# Patient Record
Sex: Female | Born: 1985 | Race: White | Hispanic: No | Marital: Married | Smoking: Never smoker
Health system: Southern US, Community
[De-identification: ages and names within clinical notes are randomized; demographics above are authoritative.]

## PROBLEM LIST (undated history)

## (undated) DIAGNOSIS — O009 Unspecified ectopic pregnancy without intrauterine pregnancy: Secondary | ICD-10-CM

## (undated) DIAGNOSIS — E119 Type 2 diabetes mellitus without complications: Secondary | ICD-10-CM

## (undated) DIAGNOSIS — K219 Gastro-esophageal reflux disease without esophagitis: Secondary | ICD-10-CM

## (undated) DIAGNOSIS — Z87442 Personal history of urinary calculi: Secondary | ICD-10-CM

## (undated) DIAGNOSIS — E282 Polycystic ovarian syndrome: Secondary | ICD-10-CM

## (undated) DIAGNOSIS — O24419 Gestational diabetes mellitus in pregnancy, unspecified control: Secondary | ICD-10-CM

## (undated) DIAGNOSIS — F32A Depression, unspecified: Secondary | ICD-10-CM

## (undated) DIAGNOSIS — J45909 Unspecified asthma, uncomplicated: Secondary | ICD-10-CM

## (undated) DIAGNOSIS — Z8759 Personal history of other complications of pregnancy, childbirth and the puerperium: Secondary | ICD-10-CM

## (undated) DIAGNOSIS — F419 Anxiety disorder, unspecified: Secondary | ICD-10-CM

## (undated) DIAGNOSIS — F329 Major depressive disorder, single episode, unspecified: Secondary | ICD-10-CM

## (undated) HISTORY — PX: DILATION AND CURETTAGE OF UTERUS: SHX78

## (undated) HISTORY — DX: Unspecified asthma, uncomplicated: J45.909

## (undated) HISTORY — DX: Unspecified ectopic pregnancy without intrauterine pregnancy: O00.90

## (undated) HISTORY — DX: Depression, unspecified: F32.A

## (undated) HISTORY — DX: Polycystic ovarian syndrome: E28.2

## (undated) HISTORY — DX: Gestational diabetes mellitus in pregnancy, unspecified control: O24.419

## (undated) HISTORY — PX: ECTOPIC PREGNANCY SURGERY: SHX613

## (undated) HISTORY — DX: Personal history of urinary calculi: Z87.442

## (undated) HISTORY — DX: Gastro-esophageal reflux disease without esophagitis: K21.9

## (undated) HISTORY — DX: Type 2 diabetes mellitus without complications: E11.9

## (undated) HISTORY — DX: Personal history of other complications of pregnancy, childbirth and the puerperium: Z87.59

## (undated) HISTORY — DX: Anxiety disorder, unspecified: F41.9

---
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
## 2016-08-06 DIAGNOSIS — E282 Polycystic ovarian syndrome: Secondary | ICD-10-CM | POA: Insufficient documentation

## 2019-04-03 DIAGNOSIS — O2620 Pregnancy care for patient with recurrent pregnancy loss, unspecified trimester: Secondary | ICD-10-CM | POA: Insufficient documentation

## 2019-04-05 DIAGNOSIS — Z8759 Personal history of other complications of pregnancy, childbirth and the puerperium: Secondary | ICD-10-CM | POA: Insufficient documentation

## 2019-04-05 DIAGNOSIS — O0910 Supervision of pregnancy with history of ectopic or molar pregnancy, unspecified trimester: Secondary | ICD-10-CM | POA: Insufficient documentation

## 2019-04-05 LAB — OB RESULTS CONSOLE HEPATITIS B SURFACE ANTIGEN: Hepatitis B Surface Ag: NEGATIVE

## 2019-04-05 LAB — OB RESULTS CONSOLE RUBELLA ANTIBODY, IGM: Rubella: IMMUNE

## 2019-05-29 DIAGNOSIS — R52 Pain, unspecified: Secondary | ICD-10-CM | POA: Insufficient documentation

## 2019-05-29 DIAGNOSIS — R000 Tachycardia, unspecified: Secondary | ICD-10-CM | POA: Insufficient documentation

## 2019-06-16 NOTE — L&D Delivery Note (Addendum)
Patient is 34 y.o. [id number] [redacted]w[redacted]d admitted for IOL for oligohydramnios.   Delivery Note At 4:11 PM a viable female was delivered via Vaginal, Spontaneous (Presentation: Right Occiput Anterior).  APGAR: 9, 9; weight pending.   Placenta status: Spontaneous, Intact.  Cord: 3 vessels with the following complications: None.  Anesthesia: None Episiotomy: None Lacerations:  First degree perineal laceration, hemostatic. No repair Suture Repair: none Est. Blood Loss (mL): 55  Mom to postpartum.  Baby to Couplet care / Skin to Skin.  Upon arrival patient was complete and pushing. She pushed with good maternal effort to deliver a healthy baby girl. Baby delivered without difficulty, was noted to have good tone and place on maternal abdomen for oral suctioning, drying and stimulation. Delayed cord clamping performed. Placenta delivered intact with 3V cord. Vaginal canal and perineum was inspected and found to have one first degree laceration that was hemostatic on exam.  We discussed repair versus healing by secondary intention and mom opted for no repair. Pitocin was started and uterus massaged until bleeding slowed. Counts of sharps, instruments, and lap pads were all correct.   Mirian Mo, MD PGY-2 5/16/20214:40 PM  OB FELLOW DELIVERY ATTESTATION  I was gloved and present for the delivery in its entirety, and I agree with the above resident's note. Indication for induction: oligohydramnios.   Patient had an uncomplicated labor course as follows: Initial SVE 1/50/-2. Patient received Foley bulb, Cytotec, Pitocin and AROM. She then progressed to complete with uncomplicated delivery.  Small first degree abrasion that was hemostatic and discussed repair; shared-decision making and patient declined.   Jerilynn Birkenhead, MD [facility] Family Medicine Fellow, [facility] for [employer], [facility]

## 2019-06-19 DIAGNOSIS — R0602 Shortness of breath: Secondary | ICD-10-CM | POA: Insufficient documentation

## 2019-06-19 DIAGNOSIS — O4402 Placenta previa specified as without hemorrhage, second trimester: Secondary | ICD-10-CM

## 2019-06-19 HISTORY — DX: Complete placenta previa nos or without hemorrhage, second trimester: O44.02

## 2019-08-21 ENCOUNTER — Ambulatory Visit (INDEPENDENT_AMBULATORY_CARE_PROVIDER_SITE_OTHER): Payer: Managed Care, Other (non HMO) | Admitting: Obstetrics & Gynecology

## 2019-08-21 ENCOUNTER — Encounter: Payer: Self-pay | Admitting: Obstetrics & Gynecology

## 2019-08-21 ENCOUNTER — Other Ambulatory Visit: Payer: Self-pay

## 2019-08-21 VITALS — BP 140/81 | HR 95 | Ht 62.0 in | Wt 169.0 lb

## 2019-08-21 DIAGNOSIS — O0990 Supervision of high risk pregnancy, unspecified, unspecified trimester: Secondary | ICD-10-CM

## 2019-08-21 DIAGNOSIS — J45909 Unspecified asthma, uncomplicated: Secondary | ICD-10-CM | POA: Insufficient documentation

## 2019-08-21 DIAGNOSIS — O4702 False labor before 37 completed weeks of gestation, second trimester: Secondary | ICD-10-CM

## 2019-08-21 DIAGNOSIS — Z6791 Unspecified blood type, Rh negative: Secondary | ICD-10-CM | POA: Insufficient documentation

## 2019-08-21 DIAGNOSIS — O0992 Supervision of high risk pregnancy, unspecified, second trimester: Secondary | ICD-10-CM

## 2019-08-21 DIAGNOSIS — O99519 Diseases of the respiratory system complicating pregnancy, unspecified trimester: Secondary | ICD-10-CM | POA: Insufficient documentation

## 2019-08-21 DIAGNOSIS — Z3A27 27 weeks gestation of pregnancy: Secondary | ICD-10-CM

## 2019-08-21 DIAGNOSIS — O26899 Other specified pregnancy related conditions, unspecified trimester: Secondary | ICD-10-CM | POA: Insufficient documentation

## 2019-08-21 NOTE — Patient Instructions (Signed)
Return to office for any scheduled appointments. Call the office or go to the MAU at [facility] at [facility] if:  You begin to have strong, frequent contractions  Your water breaks.  Sometimes it is a big gush of fluid, sometimes it is just a trickle that keeps getting your panties wet or running down your legs  You have vaginal bleeding.  It is normal to have a small amount of spotting if your cervix was checked.   You do not feel your baby moving like normal.  If you do not, get something to eat and drink and lay down and focus on feeling your baby move.   If your baby is still not moving like normal, you should call the office or go to MAU.  Any other obstetric concerns.   Preterm Labor and Birth Information  The normal length of a pregnancy is 39-41 weeks. Preterm labor is when labor starts before 37 completed weeks of pregnancy. What are the risk factors for preterm labor? Preterm labor is more likely to occur in women who:  Have certain infections during pregnancy such as a bladder infection, sexually transmitted infection, or infection inside the uterus (chorioamnionitis).  Have a shorter-than-normal cervix.  Have gone into preterm labor before.  Have had surgery on their cervix.  Are younger than age 75 or older than age 16.  Are African American.  Are pregnant with twins or multiple babies (multiple gestation).  Take street drugs or smoke while pregnant.  Do not gain enough weight while pregnant.  Became pregnant shortly after having been pregnant. What are the symptoms of preterm labor? Symptoms of preterm labor include:  Cramps similar to those that can happen during a menstrual period. The cramps may happen with diarrhea.  Pain in the abdomen or lower back.  Regular uterine contractions that may feel like tightening of the abdomen.  A feeling of increased pressure in the pelvis.  Increased watery or bloody mucus discharge from the  vagina.  Water breaking (ruptured amniotic sac). Why is it important to recognize signs of preterm labor? It is important to recognize signs of preterm labor because babies who are born prematurely may not be fully developed. This can put them at an increased risk for:  Long-term (chronic) heart and lung problems.  Difficulty immediately after birth with regulating body systems, including blood sugar, body temperature, heart rate, and breathing rate.  Bleeding in the brain.  Cerebral palsy.  Learning difficulties.  Death. These risks are highest for babies who are born before 34 weeks of pregnancy. How is preterm labor treated? Treatment depends on the length of your pregnancy, your condition, and the health of your baby. It may involve:  Having a stitch (suture) placed in your cervix to prevent your cervix from opening too early (cerclage).  Taking or being given medicines, such as: ? Hormone medicines. These may be given early in pregnancy to help support the pregnancy. ? Medicine to stop contractions. ? Medicines to help mature the baby's lungs. These may be prescribed if the risk of delivery is high. ? Medicines to prevent your baby from developing cerebral palsy. If the labor happens before 34 weeks of pregnancy, you may need to stay in the hospital. What should I do if I think I am in preterm labor? If you think that you are going into preterm labor, call your health care provider right away. How can I prevent preterm labor in future pregnancies? To increase your  chance of having a full-term pregnancy:  Do not use any tobacco products, such as cigarettes, chewing tobacco, and e-cigarettes. If you need help quitting, ask your health care provider.  Do not use street drugs or medicines that have not been prescribed to you during your pregnancy.  Talk with your health care provider before taking any herbal supplements, even if you have been taking them regularly.  Make sure  you gain a healthy amount of weight during your pregnancy.  Watch for infection. If you think that you might have an infection, get it checked right away.  Make sure to tell your health care provider if you have gone into preterm labor before. This information is not intended to replace advice given to you by your health care provider. Make sure you discuss any questions you have with your health care provider. Document Revised: 09/23/2018 Document Reviewed: 10/23/2015 Elsevier Patient Education  2020 Elsevier Inc.  

## 2019-08-21 NOTE — Progress Notes (Signed)
   PRENATAL VISIT NOTE  Subjective:  Janice Hurley is a 34 y.o. [phone] at [redacted]w[redacted]d being seen today for transfer of care from [facility].  She recently moved to this area.  She is currently monitored for the following issues for this high-risk pregnancy and has Supervision of high risk pregnancy, antepartum; Generalized pain; History of pregnancy induced hypertension; PCOS (polycystic ovarian syndrome); History of preterm premature rupture of membranes (PPROM); Shortness of breath; Tachycardia; Maternal asthma complicating pregnancy; and Rh negative state in antepartum period on their problem list.  Patient reports occasional contractions.  Contractions: Irritability. Vag. Bleeding: None.  Movement: Present. Denies leaking of fluid.   The following portions of the patient's history were reviewed and updated as appropriate: allergies, current medications, past family history, past medical history, past social history, past surgical history and problem list.   Objective:   Vitals:   08/21/19 1530 08/21/19 1533  BP: 140/81   Pulse: 95   Weight: 169 lb (76.7 kg)   Height:  5\' 5"  (1.651 m)    Fetal Status: Fetal Heart Rate (bpm): 150   Movement: Present     General:  Alert, oriented and cooperative. Patient is in no acute distress.  Skin: Skin is warm and dry. No rash noted.   Cardiovascular: Normal heart rate noted  Respiratory: Normal respiratory effort, no problems with respiration noted  Abdomen: Soft, gravid, appropriate for gestational age.  Pain/Pressure: Present     Pelvic: Cervical exam performed Dilation: Closed Effacement (%): Thick Station: Ballotable  Extremities: Normal range of motion.  Edema: Trace  Mental Status: Normal mood and affect. Normal behavior. Normal judgment and thought content.   Assessment and Plan:  Pregnancy: at [redacted]w[redacted]d 1. Preterm uterine contractions in second trimester, antepartum Closed cervix.  Patient reassured. She declined Procardia, said it did not work  for her last pregnancy.  2. Supervision of high risk pregnancy, antepartum The nature of [facility] with multiple MDs and other Advanced Practice Providers was explained to patient; also emphasized that residents, students are part of our team.  Preterm labor symptoms and general obstetric precautions including but not limited to vaginal bleeding, contractions, leaking of fluid and fetal movement were reviewed in detail with the patient. Please refer to After Visit Summary for other counseling recommendations.   Return in about 1 week (around 08/28/2019) for RN Visit for BP Check, 3rd trimester labs, Rhogam, TDap//2 weeks: OFFICE OB Visit.  Future Appointments  Date Time Provider Department Center  08/28/2019  9:00 AM [facility] NURSE [facility]  09/08/2019  9:05 AM 09/10/2019, CNM [facility]    Donette Larry, MD

## 2019-08-23 ENCOUNTER — Encounter: Payer: Self-pay | Admitting: Obstetrics & Gynecology

## 2019-08-28 ENCOUNTER — Ambulatory Visit (INDEPENDENT_AMBULATORY_CARE_PROVIDER_SITE_OTHER): Payer: Managed Care, Other (non HMO) | Admitting: *Deleted

## 2019-08-28 ENCOUNTER — Other Ambulatory Visit: Payer: Self-pay

## 2019-08-28 VITALS — BP 118/69 | HR 97 | Temp 98.4°F

## 2019-08-28 DIAGNOSIS — O0990 Supervision of high risk pregnancy, unspecified, unspecified trimester: Secondary | ICD-10-CM

## 2019-08-28 DIAGNOSIS — Z23 Encounter for immunization: Secondary | ICD-10-CM

## 2019-08-28 DIAGNOSIS — O360930 Maternal care for other rhesus isoimmunization, third trimester, not applicable or unspecified: Secondary | ICD-10-CM | POA: Diagnosis not present

## 2019-08-28 DIAGNOSIS — Z3403 Encounter for supervision of normal first pregnancy, third trimester: Secondary | ICD-10-CM

## 2019-08-28 DIAGNOSIS — Z3A28 28 weeks gestation of pregnancy: Secondary | ICD-10-CM | POA: Diagnosis not present

## 2019-08-28 MED ORDER — RHO D IMMUNE GLOBULIN 1500 UNIT/2ML IJ SOSY
300.0000 ug | PREFILLED_SYRINGE | Freq: Once | INTRAMUSCULAR | Status: AC
  Administered 2019-08-28: 300 ug via INTRAMUSCULAR

## 2019-08-29 ENCOUNTER — Encounter: Payer: Self-pay | Admitting: Obstetrics & Gynecology

## 2019-08-29 ENCOUNTER — Other Ambulatory Visit: Payer: Self-pay | Admitting: Obstetrics & Gynecology

## 2019-08-29 ENCOUNTER — Other Ambulatory Visit: Payer: Self-pay

## 2019-08-29 ENCOUNTER — Other Ambulatory Visit: Payer: Self-pay | Admitting: *Deleted

## 2019-08-29 DIAGNOSIS — D696 Thrombocytopenia, unspecified: Secondary | ICD-10-CM

## 2019-08-29 DIAGNOSIS — O24419 Gestational diabetes mellitus in pregnancy, unspecified control: Secondary | ICD-10-CM

## 2019-08-29 DIAGNOSIS — D691 Qualitative platelet defects: Secondary | ICD-10-CM | POA: Insufficient documentation

## 2019-08-29 NOTE — Progress Notes (Signed)
Referral to Nutrition and Diabetes placed per Dr.Leggett

## 2019-08-29 NOTE — Progress Notes (Signed)
Error

## 2019-08-31 LAB — CBC
HCT: 34.2 % — ABNORMAL LOW (ref 35.0–45.0)
Hemoglobin: 11.8 g/dL (ref 11.7–15.5)
MCH: 31.1 pg (ref 27.0–33.0)
MCHC: 34.5 g/dL (ref 32.0–36.0)
MCV: 90 fL (ref 80.0–100.0)
MPV: 11.3 fL (ref 7.5–12.5)
Platelets: 104 10*3/uL — ABNORMAL LOW (ref 140–400)
RBC: 3.8 10*6/uL (ref 3.80–5.10)
RDW: 12.5 % (ref 11.0–15.0)
WBC: 7.8 10*3/uL (ref 3.8–10.8)

## 2019-08-31 LAB — ANTIBODY ID, TITER, AND TYPING, RBC: Antigen Typing (2): NEGATIVE

## 2019-08-31 LAB — 2HR GTT W 1 HR, CARPENTER, 75 G
Glucose, 1 Hr, Gest: 187 mg/dL — ABNORMAL HIGH (ref 65–179)
Glucose, 2 Hr, Gest: 118 mg/dL (ref 65–152)
Glucose, Fasting, Gest: 94 mg/dL — ABNORMAL HIGH (ref 65–91)

## 2019-08-31 LAB — HIV ANTIBODY (ROUTINE TESTING W REFLEX): HIV 1&2 Ab, 4th Generation: NONREACTIVE

## 2019-08-31 LAB — ANTIBODY SCREEN: Antibody Screen: POSITIVE — AB

## 2019-08-31 LAB — RPR: RPR Ser Ql: NONREACTIVE

## 2019-09-04 ENCOUNTER — Other Ambulatory Visit: Payer: Self-pay | Admitting: Obstetrics & Gynecology

## 2019-09-04 DIAGNOSIS — O360130 Maternal care for anti-D [Rh] antibodies, third trimester, not applicable or unspecified: Secondary | ICD-10-CM

## 2019-09-05 ENCOUNTER — Encounter: Payer: Self-pay | Admitting: *Deleted

## 2019-09-05 ENCOUNTER — Telehealth: Payer: Self-pay | Admitting: *Deleted

## 2019-09-05 MED ORDER — BLOOD GLUCOSE MONITOR KIT: PACK | 0 refills | Status: DC

## 2019-09-05 NOTE — Telephone Encounter (Signed)
Pt requesting One Touch Glucose meter and testing supplies be sent to [facility].

## 2019-09-06 ENCOUNTER — Telehealth: Payer: Self-pay | Admitting: *Deleted

## 2019-09-06 ENCOUNTER — Other Ambulatory Visit: Payer: Self-pay

## 2019-09-06 ENCOUNTER — Encounter: Payer: Managed Care, Other (non HMO) | Attending: Obstetrics & Gynecology | Admitting: Registered"

## 2019-09-06 DIAGNOSIS — O99810 Abnormal glucose complicating pregnancy: Secondary | ICD-10-CM

## 2019-09-06 NOTE — Telephone Encounter (Signed)
Pt called stating taht she received a One Touch Verio Flex monitor today and needs to strips and lancets to go with the meter.  This was sent to [facility] via phone.

## 2019-09-08 ENCOUNTER — Encounter: Payer: Managed Care, Other (non HMO) | Admitting: Certified Nurse Midwife

## 2019-09-08 ENCOUNTER — Encounter: Payer: Self-pay | Admitting: Registered"

## 2019-09-08 DIAGNOSIS — O99810 Abnormal glucose complicating pregnancy: Secondary | ICD-10-CM | POA: Insufficient documentation

## 2019-09-08 NOTE — Progress Notes (Signed)
Patient was seen on 09/08/19 for Gestational Diabetes self-management class at [facility]. The following learning objectives were met by the patient during this course:   States the definition of Gestational Diabetes  States why dietary management is important in controlling blood glucose  Describes the effects each nutrient has on blood glucose levels  Demonstrates ability to create a balanced meal plan  Demonstrates carbohydrate counting   States when to check blood glucose levels  Demonstrates proper blood glucose monitoring techniques  States the effect of stress and exercise on blood glucose levels  States the importance of limiting caffeine and abstaining from alcohol and smoking  Blood glucose monitor given: [employer] Lot # [id number] x Exp: 05/14/20 Blood glucose reading: 77 mg/dL  Patient instructed to monitor glucose levels: FBS: 60 - <95; 1 hour: <140; 2 hour: <120  Patient received handouts:  Nutrition Diabetes and Pregnancy, including carb counting list  Patient will be seen for follow-up as needed.

## 2019-09-11 ENCOUNTER — Ambulatory Visit (HOSPITAL_COMMUNITY): Payer: Managed Care, Other (non HMO) | Attending: Obstetrics and Gynecology | Admitting: Obstetrics and Gynecology

## 2019-09-11 ENCOUNTER — Ambulatory Visit (INDEPENDENT_AMBULATORY_CARE_PROVIDER_SITE_OTHER): Payer: Managed Care, Other (non HMO) | Admitting: Obstetrics & Gynecology

## 2019-09-11 ENCOUNTER — Other Ambulatory Visit (HOSPITAL_COMMUNITY): Payer: Self-pay | Admitting: *Deleted

## 2019-09-11 ENCOUNTER — Encounter (HOSPITAL_COMMUNITY): Payer: Self-pay | Admitting: *Deleted

## 2019-09-11 ENCOUNTER — Encounter (HOSPITAL_COMMUNITY): Payer: Self-pay

## 2019-09-11 ENCOUNTER — Other Ambulatory Visit: Payer: Self-pay

## 2019-09-11 ENCOUNTER — Ambulatory Visit (HOSPITAL_COMMUNITY): Payer: Managed Care, Other (non HMO) | Admitting: *Deleted

## 2019-09-11 VITALS — BP 134/78 | HR 89 | Wt 169.0 lb

## 2019-09-11 DIAGNOSIS — D696 Thrombocytopenia, unspecified: Secondary | ICD-10-CM

## 2019-09-11 DIAGNOSIS — O99113 Other diseases of the blood and blood-forming organs and certain disorders involving the immune mechanism complicating pregnancy, third trimester: Secondary | ICD-10-CM | POA: Diagnosis not present

## 2019-09-11 DIAGNOSIS — O360990 Maternal care for other rhesus isoimmunization, unspecified trimester, not applicable or unspecified: Secondary | ICD-10-CM

## 2019-09-11 DIAGNOSIS — O24410 Gestational diabetes mellitus in pregnancy, diet controlled: Secondary | ICD-10-CM

## 2019-09-11 DIAGNOSIS — O24419 Gestational diabetes mellitus in pregnancy, unspecified control: Secondary | ICD-10-CM

## 2019-09-11 DIAGNOSIS — N898 Other specified noninflammatory disorders of vagina: Secondary | ICD-10-CM

## 2019-09-11 DIAGNOSIS — Z7952 Long term (current) use of systemic steroids: Secondary | ICD-10-CM | POA: Insufficient documentation

## 2019-09-11 DIAGNOSIS — Z7982 Long term (current) use of aspirin: Secondary | ICD-10-CM | POA: Diagnosis not present

## 2019-09-11 DIAGNOSIS — O0990 Supervision of high risk pregnancy, unspecified, unspecified trimester: Secondary | ICD-10-CM

## 2019-09-11 DIAGNOSIS — O99513 Diseases of the respiratory system complicating pregnancy, third trimester: Secondary | ICD-10-CM | POA: Insufficient documentation

## 2019-09-11 DIAGNOSIS — J45909 Unspecified asthma, uncomplicated: Secondary | ICD-10-CM | POA: Diagnosis not present

## 2019-09-11 DIAGNOSIS — O360993 Maternal care for other rhesus isoimmunization, unspecified trimester, fetus 3: Secondary | ICD-10-CM

## 2019-09-11 DIAGNOSIS — D691 Qualitative platelet defects: Secondary | ICD-10-CM

## 2019-09-11 DIAGNOSIS — Z3A30 30 weeks gestation of pregnancy: Secondary | ICD-10-CM | POA: Insufficient documentation

## 2019-09-11 DIAGNOSIS — O360130 Maternal care for anti-D [Rh] antibodies, third trimester, not applicable or unspecified: Secondary | ICD-10-CM | POA: Diagnosis present

## 2019-09-11 DIAGNOSIS — O360190 Maternal care for anti-D [Rh] antibodies, unspecified trimester, not applicable or unspecified: Secondary | ICD-10-CM

## 2019-09-11 MED ORDER — ALBUTEROL SULFATE HFA 108 (90 BASE) MCG/ACT IN AERS: 2.0000 | INHALATION_SPRAY | Freq: Four times a day (QID) | RESPIRATORY_TRACT | 3 refills | Status: AC | PRN

## 2019-09-11 MED ORDER — OMEPRAZOLE 20 MG PO CPDR: 20.0000 mg | DELAYED_RELEASE_CAPSULE | Freq: Every day | ORAL | 4 refills | Status: AC

## 2019-09-11 MED ORDER — METFORMIN HCL 500 MG PO TABS: ORAL_TABLET | ORAL | 3 refills | Status: DC

## 2019-09-11 NOTE — Progress Notes (Signed)
Maternal-Fetal Medicine   Name: Janice Hurley  MRN: [id number] Referring Provider: Elsie Lincoln, MD  I had the pleasure of seeing Janice Hurley today at the [facility]. She is a G9 P3144 at 30w 2d gestation and is here for consultation because of the presence of anti-D antibodies. Routine antibody titers drawn before rhogam (anti-D immunoglobulin) injection showed weakly positive anti-D titers (<1:1). Patient received rhogam after the blood was drawn for antibodies. She did not get rhogam before in this pregnancy. Early pregnancy screening showed no positive anti-D antibodies. Patient's blood type is B negative.  She has a recent diagnosis of gestational diabetes. She reports some of her postprandial blood glucose levels are high (160s). CBC also showed gestational thrombocytopenia (PLT 104K). She recently transferred her care from [facility] . She reports that nuchal-translucency measurement in the first trimester was normal (no first-trimester screening was performed) and had normal fetal anatomy scan. Most-recent ultrasound was performed at 22 weeks' gestation. No history of vaginal bleeding or abdominal trauma in this pregnancy.  Obstetric history: -10/2009: Vaginal delivery in [place] at 38 weeks of a female infant weighing 7-11 at birth. -07/2011: Vaginal delivery at [facility] at 38 weeks' gestation of a female infant weighing 7-2 at birth. Labor was induced because of gestational hypertension. -02/2015: Induction of labor at 36w 6d gestation following PROM. Delivered a healthy female infant weighing 7-2 at birth.  -08/2017: Vaginal delivery at 60 weeks' gestation (induction of labor) of a female infant weighin 7-3 at birth. She had twins and an ectopic pregnancy that was removed laparoscopically. Demise of twin B followed continuation of viable twin A. In addition, she had 3 SABs.   Patient received rhogam in all her pregnancies and following miscarriages.  Gyn: No history  of abnormal Pap smears or cervical surgeries. PMH: Asthma. Takes prednisone taper now (10th day) following exacerbation. No history of intubation. No history of hypertension or any other chronic medical conditions. Medications: Prenatal vitamins, aspirin (81 mg daily), prednisone. Allergies; NKDA. Social: Denies tobacco or drug or alcohol use. She has been married 11 years and her husband is in good health. 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 Family: No history of venous thromboembolism in the family.  I counseled the patient on the following: Anti-D antibodies: -Reassured her that antibody titers of <1:1 is not considered a critical titer that is associated with fetal hemolysis. -Antibody response can occur if insensible transfer of fetal red blood cell antigens occurs. However, it is also possible that it could be a lab error. Repeat screening should not be performed now since the patient already received rhogam. -I reassured her that with low antibody titers, one would expect good pregnancy outcomes. -I recommended (and made an appointment) for her to return tomorrow for detailed ultrasound including middle-cerebral artery (MCA) Doppler to rule out fetal anemia (for reassurance). -I discussed the significance of MCA Doppler studies.  Gestational diabetes -Counseled the patient on the importance of good blood glucose control to prevent adverse fetal and neonatal outcomes. -Discussed the role of diet. Treatment includes oral hypoglycemics (metformin) or insulin. I encouraged the patient to check her blood glucose regularly and discuss with her provider. -Complications of poor control include fetal macrosomia, shoulder dystocia and neurological injuries at birth.  Gestational thrombocytopenia Low platelet count seen is most-likely gestational thrombocytopenia. Although it is difficult to differentiate from immune thrombocytopenia (ITP), normal baseline platelet count is consistent with the diagnosis  of gestational thrombocytopenia (GT). It occurs in up to 6%  of pregnant women and is not associated with any adverse maternal or neonatal outcomes. Platelet count below 70K (or 90K by some) is considered a contraindication, by some anesthesiologists, for regional anesthesia.  It is reasonable to consult anesthesiologist at 37-39 weeks and if considered low, a short course of prednisone may elevate the platelet count (not proven by established studies) enough for her to have epidural analgesia.  Recommendations: -Detailed ultrasound and MCA Doppler tomorrow. -Fetal growth assessment every 4 weeks. -If patient takes oral hypoglycemics or insulin, weekly BPP from 32 weeks' gestation till delivery. -Check antibodies after delivery and if less than 1:8, rhogam can be given. -Repeat CBC (platelet) at 37 weeks' gestation and if PLT is less than 80K, discuss with anesthesilogist.   Thank you for consultation. If you have any questions, please contact me at the [facility]. Consultation including face-to-face counseling: 40 minutes.

## 2019-09-11 NOTE — Progress Notes (Signed)
   PRENATAL VISIT NOTE  Subjective:  Janice Hurley is a 34 y.o. [phone] at [redacted]w[redacted]d being seen today for ongoing prenatal care.  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  Patient reports no complaints.  Contractions: Irregular. Vag. Bleeding: None.  Movement: Present. Denies leaking of fluid.   The following portions of the patient's history were reviewed and updated as appropriate: allergies, current medications, past family history, past medical history, past social history, past surgical history and problem list.   Objective:   Vitals:   09/11/19 1603  BP: 134/78  Pulse: 89  Weight: 169 lb (76.7 kg)    Fetal Status: Fetal Heart Rate (bpm): 158   Movement: Present     General:  Alert, oriented and cooperative. Patient is in no acute distress.  Skin: Skin is warm and dry. No rash noted.   Cardiovascular: Normal heart rate noted  Respiratory: Normal respiratory effort, no problems with respiration noted  Abdomen: Soft, gravid, appropriate for gestational age.  Pain/Pressure: Absent     Pelvic: Cervical exam deferred       cervix closed/thick/high, normal discharge noted  Extremities: Normal range of motion.  Edema: Trace  Mental Status: Normal mood and affect. Normal behavior. Normal judgment and thought content.   Assessment and Plan:  Pregnancy: G4W1027 at [redacted]w[redacted]d 1. Thrombocytopenia (HCC) - MFM u/s tomorrow - 104 K on 08/28/19 - recheck today  2.  Gestational diabetes mellitus (GDM), antepartum, gestational diabetes method of control unspecified - fastings are 90+, 2 hour sugars mostly normal - metformin 500 mg qhs prescribed  3. Supervision of high risk pregnancy, antepartum   4. Vaginal discharge  - Cervicovaginal ancillary only( [place])  5. Vaginal odor  - Cervicovaginal ancillary only( [place])  5. GERD symptoms- rec stop Tums and start prilosec  6. Asthma no better after taking oral steroids- I have prescribed proventil inhaler since she is not using her nebulizer. She will speak with her asthma doctor tomorrow.  Preterm labor symptoms and general obstetric precautions including but not limited to vaginal bleeding, contractions, leaking of fluid and fetal movement were reviewed in detail with the patient. Please refer to After Visit Summary for other counseling recommendations.   No follow-ups on file.  Future Appointments  Date Time Provider Department Center  09/12/2019 10:30 AM [facility] [place] 5 [facility]    Allie Bossier, MD

## 2019-09-11 NOTE — Progress Notes (Signed)
Pt c/o vaginal discharge with odor

## 2019-09-12 ENCOUNTER — Encounter (HOSPITAL_COMMUNITY): Payer: Self-pay

## 2019-09-12 ENCOUNTER — Ambulatory Visit (HOSPITAL_COMMUNITY)
Admission: RE | Admit: 2019-09-12 | Discharge: 2019-09-12 | Disposition: A | Discharge status: Home / Self Care | Payer: Managed Care, Other (non HMO) | Source: Ambulatory Visit | Attending: Obstetrics & Gynecology | Admitting: Obstetrics & Gynecology

## 2019-09-12 ENCOUNTER — Ambulatory Visit (HOSPITAL_COMMUNITY): Payer: Managed Care, Other (non HMO) | Admitting: *Deleted

## 2019-09-12 ENCOUNTER — Other Ambulatory Visit (HOSPITAL_COMMUNITY): Payer: Self-pay | Admitting: *Deleted

## 2019-09-12 DIAGNOSIS — D691 Qualitative platelet defects: Secondary | ICD-10-CM | POA: Diagnosis present

## 2019-09-12 DIAGNOSIS — O09213 Supervision of pregnancy with history of pre-term labor, third trimester: Secondary | ICD-10-CM | POA: Diagnosis not present

## 2019-09-12 DIAGNOSIS — O24415 Gestational diabetes mellitus in pregnancy, controlled by oral hypoglycemic drugs: Secondary | ICD-10-CM

## 2019-09-12 DIAGNOSIS — O360190 Maternal care for anti-D [Rh] antibodies, unspecified trimester, not applicable or unspecified: Secondary | ICD-10-CM | POA: Diagnosis present

## 2019-09-12 DIAGNOSIS — O360130 Maternal care for anti-D [Rh] antibodies, third trimester, not applicable or unspecified: Secondary | ICD-10-CM

## 2019-09-12 DIAGNOSIS — O09293 Supervision of pregnancy with other poor reproductive or obstetric history, third trimester: Secondary | ICD-10-CM | POA: Diagnosis not present

## 2019-09-12 DIAGNOSIS — Z3A30 30 weeks gestation of pregnancy: Secondary | ICD-10-CM

## 2019-09-12 DIAGNOSIS — Z363 Encounter for antenatal screening for malformations: Secondary | ICD-10-CM | POA: Diagnosis not present

## 2019-09-12 DIAGNOSIS — Z3182 Encounter for Rh incompatibility status: Secondary | ICD-10-CM

## 2019-09-12 LAB — CBC
HCT: 35.3 % (ref 35.0–45.0)
Hemoglobin: 12.3 g/dL (ref 11.7–15.5)
MCH: 32 pg (ref 27.0–33.0)
MCHC: 34.8 g/dL (ref 32.0–36.0)
MCV: 91.9 fL (ref 80.0–100.0)
MPV: 11.3 fL (ref 7.5–12.5)
Platelets: 195 10*3/uL (ref 140–400)
RBC: 3.84 Million/uL (ref 3.80–5.10)
RDW: 12.7 % (ref 11.0–15.0)
WBC: 14 10*3/uL — ABNORMAL HIGH (ref 3.8–10.8)

## 2019-09-12 LAB — CERVICOVAGINAL ANCILLARY ONLY
Bacterial Vaginitis (gardnerella): NEGATIVE
Candida Glabrata: NEGATIVE
Candida Vaginitis: NEGATIVE
Comment: NEGATIVE
Comment: NEGATIVE
Comment: NEGATIVE

## 2019-09-26 ENCOUNTER — Encounter: Payer: Self-pay | Admitting: Certified Nurse Midwife

## 2019-09-26 ENCOUNTER — Ambulatory Visit (INDEPENDENT_AMBULATORY_CARE_PROVIDER_SITE_OTHER): Payer: Managed Care, Other (non HMO) | Admitting: Certified Nurse Midwife

## 2019-09-26 ENCOUNTER — Other Ambulatory Visit: Payer: Self-pay

## 2019-09-26 VITALS — BP 130/74 | HR 90 | Wt 173.0 lb

## 2019-09-26 DIAGNOSIS — Z3A32 32 weeks gestation of pregnancy: Secondary | ICD-10-CM

## 2019-09-26 DIAGNOSIS — O2623 Pregnancy care for patient with recurrent pregnancy loss, third trimester: Secondary | ICD-10-CM

## 2019-09-26 DIAGNOSIS — O99810 Abnormal glucose complicating pregnancy: Secondary | ICD-10-CM

## 2019-09-26 DIAGNOSIS — D691 Qualitative platelet defects: Secondary | ICD-10-CM

## 2019-09-26 DIAGNOSIS — O2620 Pregnancy care for patient with recurrent pregnancy loss, unspecified trimester: Secondary | ICD-10-CM

## 2019-09-26 DIAGNOSIS — O4402 Placenta previa specified as without hemorrhage, second trimester: Secondary | ICD-10-CM

## 2019-09-26 DIAGNOSIS — O26893 Other specified pregnancy related conditions, third trimester: Secondary | ICD-10-CM

## 2019-09-26 DIAGNOSIS — Z6791 Unspecified blood type, Rh negative: Secondary | ICD-10-CM

## 2019-09-26 LAB — COMPREHENSIVE METABOLIC PANEL
AG Ratio: 2.1 (calc) (ref 1.0–2.5)
ALT: 13 U/L (ref 6–29)
AST: 16 U/L (ref 10–30)
Albumin: 3.6 g/dL (ref 3.6–5.1)
Alkaline phosphatase (APISO): 56 U/L (ref 31–125)
BUN: 13 mg/dL (ref 7–25)
CO2: 21 mmol/L (ref 20–32)
Calcium: 8.9 mg/dL (ref 8.6–10.2)
Chloride: 108 mmol/L (ref 98–110)
Creat: 0.6 mg/dL (ref 0.50–1.10)
Globulin: 1.7 g/dL (calc) — ABNORMAL LOW (ref 1.9–3.7)
Glucose, Bld: 97 mg/dL (ref 65–99)
Potassium: 3.8 mmol/L (ref 3.5–5.3)
Sodium: 138 mmol/L (ref 135–146)
Total Bilirubin: 0.3 mg/dL (ref 0.2–1.2)
Total Protein: 5.3 g/dL — ABNORMAL LOW (ref 6.1–8.1)

## 2019-09-26 LAB — CBC
HCT: 35.6 % (ref 35.0–45.0)
Hemoglobin: 12.1 g/dL (ref 11.7–15.5)
MCH: 31.8 pg (ref 27.0–33.0)
MCHC: 34 g/dL (ref 32.0–36.0)
MCV: 93.4 fL (ref 80.0–100.0)
MPV: 10.8 fL (ref 7.5–12.5)
Platelets: 97 10*3/uL — ABNORMAL LOW (ref 140–400)
RBC: 3.81 10*6/uL (ref 3.80–5.10)
RDW: 13.5 % (ref 11.0–15.0)
WBC: 9.4 10*3/uL (ref 3.8–10.8)

## 2019-09-26 MED ORDER — METFORMIN HCL 500 MG PO TABS: 500.0000 mg | ORAL_TABLET | Freq: Two times a day (BID) | ORAL | 5 refills | Status: DC

## 2019-09-26 NOTE — Patient Instructions (Signed)

## 2019-09-26 NOTE — Progress Notes (Addendum)
PRENATAL VISIT NOTE  Subjective:  Janice Hurley is a 34 y.o. [phone] at [redacted]w[redacted]d being seen today for ongoing prenatal care.  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  Patient reports continued irregular BH contractions and fatigue.  Contractions: Irregular. Vag. Bleeding: None.  Movement: Present. Denies leaking of fluid.   The following portions of the patient's history were reviewed and updated as appropriate: allergies, current medications, past family history, past medical history, past social history, past surgical history and problem list.   Objective:   Vitals:   09/26/19 0826  BP: 130/74  Pulse: 90  Weight: 173 lb (78.5 kg)    Fetal Status: Fetal Heart Rate (bpm): 142 Fundal Height: 34 cm Movement: Present     General:  Alert, oriented and cooperative. Patient is in no acute distress.  Skin: Skin is warm and dry. No rash noted.   Cardiovascular: Normal heart rate noted  Respiratory: Normal respiratory effort, no problems with respiration noted  Abdomen: Soft, gravid, appropriate for gestational age.  Pain/Pressure: Present     Pelvic: Cervical exam deferred        Extremities: Normal range of motion.  Edema: Trace  Mental Status: Normal mood and affect. Normal behavior. Normal judgment and thought content.   Assessment and Plan:  Pregnancy: [id number] at [redacted]w[redacted]d 1. 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  - Patient reports BP in early pregnancy was low around 105/70s. Patient reports BP continues to creep up and starting to have occasional headaches with vision changes.  - Patient denies HA, vision changes or RUQ pain currently  - BP 130/74 today, will obtain PEC labs today d/t symptoms  - Educated and discussed with patient reasons to present to MAU for evaluation, will call or send mychart message with abnormal results  - Anticipatory guidance on upcoming appointments  - Comprehensive metabolic panel - Protein / creatinine ratio, urine  2. Placenta previa in second trimester - resolved and taken out of problem list   3. Rh negative state in antepartum period - Rhogam given on 3/15  4. Abnormal glucose tolerance test (GTT) during pregnancy, antepartum - Fasting CBGs ranging from 84-100 with 50% elevated  - 2hr PP CBGs ranging from 90-141 with 20% elevated  - Patient reports taking her fasting CBG around 0900 which is 12 hours after last meal and 13 hours after night Metformin - Increased dosage of metformin, encouraged patient to take Metformin in morning around 0800 and at bedtime around 2200. Discussed with patient to eat protein snack prior to bed since dinner is around 9pm.  - metFORMIN (GLUCOPHAGE) 500 MG tablet; Take 1 tablet (500 mg total) by mouth 2 (two) times daily at 8 am and 10 pm.  Dispense: 60 tablet; Refill:  5  5. Thrombocytopathia ([place]) - Repeat CBC obtained today, last on 3/29 PLTs 195 - 3/15, PLTs 104 - CBC  Preterm labor symptoms and general obstetric precautions including but not limited to vaginal bleeding, contractions, leaking of fluid and fetal movement  were reviewed in detail with the patient. Please refer to After Visit Summary for other counseling recommendations.   Return in about 2 weeks (around 10/10/2019) for ROB.  Future Appointments  Date Time Provider [place]  09/27/2019  3:45 PM [place] NURSE [place] [facility]  09/27/2019  3:45 PM [place] 3 [facility]  10/02/2019  3:45 PM [place] NURSE [place] [facility]FC-US  10/02/2019  3:45 PM [place] 2 WH-[facility]FCUS M[facility]-US  10/09/2019  8:45 AM Guss Bunde, MD [facility]  10/10/2019  3:45 PM [place] NURSE [place] [facility]-US  10/10/2019  3:45 PM [place] Ariely Riddell, CNM

## 2019-09-27 ENCOUNTER — Ambulatory Visit (HOSPITAL_COMMUNITY): Payer: Managed Care, Other (non HMO) | Admitting: *Deleted

## 2019-09-27 ENCOUNTER — Ambulatory Visit (HOSPITAL_COMMUNITY)
Admission: RE | Admit: 2019-09-27 | Discharge: 2019-09-27 | Disposition: A | Discharge status: Home / Self Care | Payer: Managed Care, Other (non HMO) | Source: Ambulatory Visit | Attending: Obstetrics and Gynecology | Admitting: Obstetrics and Gynecology

## 2019-09-27 ENCOUNTER — Encounter (HOSPITAL_COMMUNITY): Payer: Self-pay

## 2019-09-27 DIAGNOSIS — Z3A32 32 weeks gestation of pregnancy: Secondary | ICD-10-CM

## 2019-09-27 DIAGNOSIS — O360130 Maternal care for anti-D [Rh] antibodies, third trimester, not applicable or unspecified: Secondary | ICD-10-CM

## 2019-09-27 DIAGNOSIS — D691 Qualitative platelet defects: Secondary | ICD-10-CM

## 2019-09-27 DIAGNOSIS — O24415 Gestational diabetes mellitus in pregnancy, controlled by oral hypoglycemic drugs: Secondary | ICD-10-CM

## 2019-09-27 DIAGNOSIS — Z3182 Encounter for Rh incompatibility status: Secondary | ICD-10-CM | POA: Diagnosis present

## 2019-09-27 LAB — PROTEIN / CREATININE RATIO, URINE
Creatinine, Urine: 66 mg/dL (ref 20–275)
Protein/Creat Ratio: 182 mg/g creat — ABNORMAL HIGH (ref 21–161)
Protein/Creatinine Ratio: 0.182 mg/mg creat — ABNORMAL HIGH (ref 0.021–0.16)
Total Protein, Urine: 12 mg/dL (ref 5–24)

## 2019-10-02 ENCOUNTER — Ambulatory Visit (HOSPITAL_COMMUNITY): Payer: Managed Care, Other (non HMO)

## 2019-10-03 ENCOUNTER — Ambulatory Visit (HOSPITAL_COMMUNITY)
Admission: RE | Admit: 2019-10-03 | Discharge: 2019-10-03 | Disposition: A | Discharge status: Home / Self Care | Payer: Managed Care, Other (non HMO) | Source: Ambulatory Visit | Attending: Obstetrics and Gynecology | Admitting: Obstetrics and Gynecology

## 2019-10-03 ENCOUNTER — Other Ambulatory Visit: Payer: Self-pay

## 2019-10-03 ENCOUNTER — Encounter (HOSPITAL_COMMUNITY): Payer: Self-pay

## 2019-10-03 ENCOUNTER — Ambulatory Visit (HOSPITAL_COMMUNITY): Payer: Managed Care, Other (non HMO) | Admitting: *Deleted

## 2019-10-03 VITALS — BP 127/60 | HR 94 | Temp 98.2°F

## 2019-10-03 DIAGNOSIS — O09213 Supervision of pregnancy with history of pre-term labor, third trimester: Secondary | ICD-10-CM

## 2019-10-03 DIAGNOSIS — O09293 Supervision of pregnancy with other poor reproductive or obstetric history, third trimester: Secondary | ICD-10-CM

## 2019-10-03 DIAGNOSIS — O09219 Supervision of pregnancy with history of pre-term labor, unspecified trimester: Secondary | ICD-10-CM | POA: Insufficient documentation

## 2019-10-03 DIAGNOSIS — O24415 Gestational diabetes mellitus in pregnancy, controlled by oral hypoglycemic drugs: Secondary | ICD-10-CM

## 2019-10-03 DIAGNOSIS — O09299 Supervision of pregnancy with other poor reproductive or obstetric history, unspecified trimester: Secondary | ICD-10-CM | POA: Insufficient documentation

## 2019-10-03 DIAGNOSIS — D691 Qualitative platelet defects: Secondary | ICD-10-CM

## 2019-10-03 DIAGNOSIS — O360130 Maternal care for anti-D [Rh] antibodies, third trimester, not applicable or unspecified: Secondary | ICD-10-CM | POA: Insufficient documentation

## 2019-10-03 DIAGNOSIS — Z3A33 33 weeks gestation of pregnancy: Secondary | ICD-10-CM | POA: Insufficient documentation

## 2019-10-03 DIAGNOSIS — Z3182 Encounter for Rh incompatibility status: Secondary | ICD-10-CM

## 2019-10-04 ENCOUNTER — Encounter (HOSPITAL_COMMUNITY): Payer: Self-pay | Admitting: Family Medicine

## 2019-10-04 ENCOUNTER — Inpatient Hospital Stay (HOSPITAL_COMMUNITY): Payer: Managed Care, Other (non HMO)

## 2019-10-04 ENCOUNTER — Ambulatory Visit (INDEPENDENT_AMBULATORY_CARE_PROVIDER_SITE_OTHER): Payer: Managed Care, Other (non HMO) | Admitting: Obstetrics and Gynecology

## 2019-10-04 ENCOUNTER — Inpatient Hospital Stay (HOSPITAL_COMMUNITY)
Admission: AD | Admit: 2019-10-04 | Discharge: 2019-10-04 | Disposition: A | Discharge status: Home / Self Care | Payer: Managed Care, Other (non HMO) | Attending: Family Medicine | Admitting: Family Medicine

## 2019-10-04 ENCOUNTER — Other Ambulatory Visit: Payer: Self-pay

## 2019-10-04 VITALS — BP 119/70 | HR 98 | Temp 98.8°F | Wt 173.0 lb

## 2019-10-04 DIAGNOSIS — Z7982 Long term (current) use of aspirin: Secondary | ICD-10-CM | POA: Insufficient documentation

## 2019-10-04 DIAGNOSIS — R109 Unspecified abdominal pain: Secondary | ICD-10-CM

## 2019-10-04 DIAGNOSIS — O99283 Endocrine, nutritional and metabolic diseases complicating pregnancy, third trimester: Secondary | ICD-10-CM | POA: Diagnosis not present

## 2019-10-04 DIAGNOSIS — O99613 Diseases of the digestive system complicating pregnancy, third trimester: Secondary | ICD-10-CM | POA: Diagnosis not present

## 2019-10-04 DIAGNOSIS — M549 Dorsalgia, unspecified: Secondary | ICD-10-CM

## 2019-10-04 DIAGNOSIS — Z79899 Other long term (current) drug therapy: Secondary | ICD-10-CM | POA: Diagnosis not present

## 2019-10-04 DIAGNOSIS — Z3A33 33 weeks gestation of pregnancy: Secondary | ICD-10-CM | POA: Diagnosis not present

## 2019-10-04 DIAGNOSIS — K219 Gastro-esophageal reflux disease without esophagitis: Secondary | ICD-10-CM | POA: Insufficient documentation

## 2019-10-04 DIAGNOSIS — O99891 Other specified diseases and conditions complicating pregnancy: Secondary | ICD-10-CM | POA: Diagnosis not present

## 2019-10-04 DIAGNOSIS — O2623 Pregnancy care for patient with recurrent pregnancy loss, third trimester: Secondary | ICD-10-CM

## 2019-10-04 DIAGNOSIS — Z0371 Encounter for suspected problem with amniotic cavity and membrane ruled out: Secondary | ICD-10-CM | POA: Diagnosis not present

## 2019-10-04 DIAGNOSIS — N1330 Unspecified hydronephrosis: Secondary | ICD-10-CM | POA: Insufficient documentation

## 2019-10-04 DIAGNOSIS — E282 Polycystic ovarian syndrome: Secondary | ICD-10-CM | POA: Insufficient documentation

## 2019-10-04 DIAGNOSIS — O26893 Other specified pregnancy related conditions, third trimester: Secondary | ICD-10-CM

## 2019-10-04 DIAGNOSIS — O99810 Abnormal glucose complicating pregnancy: Secondary | ICD-10-CM

## 2019-10-04 DIAGNOSIS — Z87442 Personal history of urinary calculi: Secondary | ICD-10-CM | POA: Insufficient documentation

## 2019-10-04 DIAGNOSIS — O2620 Pregnancy care for patient with recurrent pregnancy loss, unspecified trimester: Secondary | ICD-10-CM

## 2019-10-04 DIAGNOSIS — O24415 Gestational diabetes mellitus in pregnancy, controlled by oral hypoglycemic drugs: Secondary | ICD-10-CM

## 2019-10-04 LAB — POCT URINALYSIS DIPSTICK
Appearance: NORMAL
Bilirubin, UA: NEGATIVE
Blood, UA: NEGATIVE
Glucose, UA: NEGATIVE
Ketones, UA: NEGATIVE
Leukocytes, UA: NEGATIVE
Nitrite, UA: NEGATIVE
Protein, UA: NEGATIVE
Spec Grav, UA: 1.02 (ref 1.010–1.025)
Urobilinogen, UA: NEGATIVE E.U./dL — AB
pH, UA: 7 (ref 5.0–8.0)

## 2019-10-04 LAB — AMNISURE RUPTURE OF MEMBRANE (ROM) NOT AT ARMC: Amnisure ROM: NEGATIVE

## 2019-10-04 NOTE — MAU Note (Signed)
US showed lower fluid level.  Has had ? Leaking, trickle for a while. No bleeding.  Is concerned about her kidneys, hx of stones in this preg.  Bilateral flank pain, in the last 2 days has had stabbing pains, right now is just a cramping sensation that radiates down her legs.

## 2019-10-04 NOTE — Progress Notes (Signed)
Increase pain in back and radiating to legs

## 2019-10-04 NOTE — Progress Notes (Signed)
   PRENATAL VISIT NOTE  Subjective:  Janice Hurley is a 34 y.o. [phone] at [redacted]w[redacted]d being seen today for ongoing prenatal care.  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  Patient reports continued joint pain, bilateral flank pain, and leaking of fluid x several weeks.  Contractions: Irritability. Vag. Bleeding: None.  Movement: Present. Denies leaking of fluid.   The following portions of the patient's history were reviewed and updated as appropriate: allergies, current medications, past family history, past medical history, past social history, past surgical history and problem list.   Objective:   Vitals:   10/04/19 1004  BP: 119/70  Pulse: 98  Temp: 98.8 F (37.1 C)  Weight: 173 lb (78.5 kg)    Fetal Status: Fetal Heart Rate (bpm): 165 Fundal Height: 35 cm Movement: Present     General:  Alert, oriented and cooperative. Patient is in no acute distress.  Skin: Skin is warm and dry. No rash noted.   Cardiovascular: Normal heart rate noted  Respiratory: Normal respiratory effort, no problems with respiration noted  Abdomen: Soft, gravid, appropriate for gestational age. Mild, bilateral CVA tenderness.  Pain/Pressure: Present     Pelvic: Cervical exam performed in the presence of a chaperone     Pelvic exam with speculum: no pooling of fluid, dry vaginal mucosa. Cervix closed, thick, posterior.  Extremities: Normal range of motion.  Edema: Trace  Mental Status: Normal mood and affect. Normal  behavior. Normal judgment and thought content.   Assessment and Plan:  Pregnancy: [id number] at [redacted]w[redacted]d  1. 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 Back pain affecting pregnancy in third trimester  - POCT Urinalysis Dipstick - Culture, OB Urine - Paitent to go to MAU for further evaluation for leaking of fluid. Anticipate amniusre, and renal US.   3. Gestational Diabetes  2/14 elevated fasting readings. 2/14 elevated Am fasting, 4/14 elevated lunch post prandial. Discussed patient with Dr. Alysia Penna, will increase metformin to 1500 mg daily. 500 am at bedtime, 1000 mg am dose.  Keep growth [place] next week.   Preterm labor symptoms and general obstetric precautions including but not limited to vaginal bleeding, contractions, leaking of fluid and fetal movement were reviewed in detail with the patient. Please refer to After Visit Summary for other counseling recommendations.   Return in about 2 weeks (around 10/18/2019) for MD only.  Future Appointments  Date Time Provider Department Center  10/10/2019  3:45 PM [facility] NURSE [facility]  10/10/2019  3:45 PM [facility] [place] 2 [facility]  10/16/2019 11:15 AM Anyanwu, Jethro Bastos, MD [facility]    Venia Carbon, NP

## 2019-10-04 NOTE — Discharge Instructions (Signed)
Flank Pain, Adult Flank pain is pain in your side. The flank is the area of your side between your upper belly (abdomen) and your back. The pain may occur over a short time (acute), or it may be long-term or come back often (chronic). It may be mild or very bad. Pain in this area can be caused by many different things. Follow these instructions at home:   Drink enough fluid to keep your pee (urine) clear or pale yellow.  Rest as told by your doctor.  Take over-the-counter and prescription medicines only as told by your doctor.  Keep a journal to keep track of: ? What has caused your flank pain. ? What has made it feel better.  Keep all follow-up visits as told by your doctor. This is important. Contact a doctor if:  Medicine does not help your pain.  You have new symptoms.  Your pain gets worse.  You have a fever.  Your symptoms last longer than 2-3 days.  You have trouble peeing.  You are peeing more often than normal. Get help right away if:  You have trouble breathing.  You are short of breath.  Your belly hurts, or it is swollen or red.  You feel sick to your stomach (nauseous).  You throw up (vomit).  You feel like you will pass out, or you do pass out (faint).  You have blood in your pee. Summary  Flank pain is pain in your side. The flank is the area of your side between your upper belly (abdomen) and your back.  Flank pain may occur over a short time (acute), or it may be long-term or come back often (chronic). It may be mild or very bad.  Pain in this area can be caused by many different things.  Contact your doctor if your symptoms get worse or they last longer than 2-3 days. This information is not intended to replace advice given to you by your health care provider. Make sure you discuss any questions you have with your health care provider. Document Revised: 05/14/2017 Document Reviewed: 09/21/2016 Elsevier Patient Education  2020 Elsevier  Inc.  

## 2019-10-04 NOTE — MAU Provider Note (Signed)
History     CSN: [id number]  Arrival date and time: 10/04/19 1252   First Provider Initiated Contact with Patient 10/04/19 1357      Chief Complaint  Patient presents with  . Flank Pain  . Rupture of Membranes   HPI   Janice Hurley is a 34 y.o. female [phone] @ 78w4dhere in MAU for further evaluation of bilateral flank pain and ? Leaking of fluid. She was told by MFM yesterday that AFI was on the low- normal spectrum. She was told to f/u in the office today which she did. In the office today vaginal shows no pooling of fluid, fern negative. [facility] in bilateral flank. Hx of kidney stones and wants to make sure her stones have not returned. The pain comes and goes and at times radiates down her legs. The pain is similar to that of kidney stone pain.   OB History    Gravida  9   Para  3   Term  3   Preterm  0   AB  5   Living  4     SAB  4   TAB      Ectopic  1   Multiple      Live Births  3           Past Medical History:  Diagnosis Date  . Anxiety   . Asthma   . Depression   . Diabetes mellitus without complication ([place]   . Ectopic pregnancy   . GERD (gastroesophageal reflux disease)   . Gestational diabetes   . History of kidney stones   . History of miscarriage   . PCOS (polycystic ovarian syndrome)   . Placenta previa in second trimester 06/19/2019   Resolved on 07/2019 ultrasound   Posterior placenta previa (noted at anatomy [place]/09[place]2019)  Patient counseled and bleeding precautions given   Plan:  Ultrasound at 32 weeks   Last Assessment & Plan:  Posterior placenta previa (noted at anatomy [place]09[place]2019)  Patient previously counseled re: [place]; pelvic rest/bleeding precautions given  Plan for repeat ultrasound at 32 weeks  Formatting of this     Past Surgical History:  Procedure Laterality Date  . DILATION AND CURETTAGE OF UTERUS    . DILATION AND CURETTAGE OF UTERUS    . ECTOPIC PREGNANCY  SURGERY      Family History  Problem Relation Age of Onset  . Kidney cancer Father   . Stroke Maternal Grandmother   . Diabetes type I Maternal Grandfather     Social History   Tobacco Use  . Smoking status: Never Smoker  . Smokeless tobacco: Never Used  Substance Use Topics  . Alcohol use: Never  . Drug use: Never    Allergies: No Known Allergies  Medications Prior to Admission  Medication Sig Dispense Refill Last Dose  . albuterol (PROVENTIL) (2.5 MG/3ML) 0.083% nebulizer solution Inhale into the lungs.   Past Month at Unknown time  . albuterol (VENTOLIN HFA) 108 (90 Base) MCG/ACT inhaler Inhale 2 puffs into the lungs every 6 (six) hours as needed for wheezing or shortness of breath. 10 g 3 10/03/2019 at Unknown time  . aspirin 81 MG EC tablet Take by mouth.   10/03/2019 at Unknown time  . B Complex Vitamins (VITAMIN B COMPLEX) TABS Take by mouth.   10/04/2019 at Unknown time  . blood glucose meter kit and supplies KIT Dispense based on  patient and insurance preference. Use up to four times daily as directed. (FOR ICD-9 250.00, 250.01). One Touch Meter and supplies for CBG testing QID 1 each 0 10/04/2019 at Unknown time  . budesonide-formoterol (SYMBICORT) 80-4.5 MCG/ACT inhaler Inhale into the lungs.   10/04/2019 at Unknown time  . magnesium 30 MG tablet Take 30 mg by mouth 2 (two) times daily.   10/04/2019 at Unknown time  . metFORMIN (GLUCOPHAGE) 500 MG tablet Take 1 tablet (500 mg total) by mouth 2 (two) times daily at 8 am and 10 pm. 60 tablet 5 10/04/2019 at Unknown time  . omeprazole (PRILOSEC) 20 MG capsule Take 1 capsule (20 mg total) by mouth daily. 30 capsule 4 10/03/2019 at Unknown time  . Prenatal Vit-Fe Fumarate-FA (MULTIVITAMIN-PRENATAL) 27-0.8 MG TABS tablet Take 1 tablet by mouth daily at 12 noon.   10/04/2019 at Unknown time  . Spacer/Aero-Holding Chambers ([place]) [place]    10/04/2019 at Unknown time  . PREDNISONE PO Take by mouth.      Results for orders  placed or performed during the hospital encounter of 10/04/19 (from the past 48 hour(s))  Amnisure rupture of membrane (rom)not at [facility]     Status: None   Collection Time: 10/04/19  2:30 PM  Result Value Ref Range   Amnisure ROM NEGATIVE     Comment: Performed at [facility], [address], [place], [place] [address]   US RENAL  Result Date: 10/04/2019 CLINICAL DATA:  Bilateral flank pain for 2 days, history of left renal calculi, [redacted] weeks pregnant EXAM: RENAL / URINARY TRACT ULTRASOUND COMPLETE COMPARISON:  None. FINDINGS: Right Kidney: Renal measurements: 11.6 x 5.0 x 5.1 cm = volume: 166 mL. Echotexture is normal. There is a 1 cm simple cortical cyst in the interpolar region. There is moderate right hydronephrosis which may be functional given gravid uterus. No urinary tract calculi. Left Kidney: Renal measurements: 10.8 x 5.6 x 4.8 cm = volume: 152 mL. Echotexture is normal. No hydronephrosis or nephrolithiasis. Bladder: Appears normal for degree of bladder distention. Other: None. IMPRESSION: 1. Moderate right hydronephrosis, which could be functional given gravid uterus. 2. No evidence of nephrolithiasis. Electronically Signed   By: Randa Ngo M.D.   On: 10/04/2019 15:27   [place] MFM FETAL BPP WO NON STRESS  Result Date: 10/03/2019 ----------------------------------------------------------------------  OBSTETRICS REPORT                       (Signed Final 10/03/2019 09:27 am) ---------------------------------------------------------------------- Patient Info  ID #:       [id number]                          D.O.B.:  July 06, 1985 (34 yrs)  Name:       Janice Hurley                      Visit Date: 10/03/2019 08:00 am ---------------------------------------------------------------------- Performed By  Performed By:     Jeanene Erb BS,      Ref. Address:     [place]  [place], [place]  Attending:         Johnell Comings MD         [facility]  Referred By:      [facility] ---------------------------------------------------------------------- Orders   #  Description                          Code         Ordered By   1  [place] MFM FETAL BPP WO NON              76819.01     RAVI [facility]      STRESS  ----------------------------------------------------------------------   #  Order #                    Accession #                 Episode #   1  [id number]                  [id number]                  [id number]  ---------------------------------------------------------------------- Indications   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,      O36.0130   third trimester, unspecified   Gestational diabetes in pregnancy,             O24.415   controlled by oral hypoglycemic drugs   (metformin)   Poor obstetric history: Previous preterm       O09.219   delivery, antepartum (36+6 weeks)   Poor obstetric history: Previous gestational   O11.299   HTN   [redacted] weeks gestation of pregnancy                Z3A.33  ---------------------------------------------------------------------- Fetal Evaluation  Num Of Fetuses:         1  Fetal Heart Rate(bpm):  130  Cardiac Activity:       Observed  Presentation:           Cephalic  Placenta:               Posterior  P. Cord Insertion:      Previously Visualized  AFI Sum(cm)     %Tile       Largest Pocket(cm)  8.7             9           3.61  RUQ(cm)                     LUQ(cm)        LLQ(cm)  1.86                        3.23           3.61 ---------------------------------------------------------------------- Biophysical Evaluation  Amniotic F.V:   Pocket => 2 cm             F. Tone:        Observed  F. Movement:    Observed                   Score:          8/8  F. Breathing:   Observed ---------------------------------------------------------------------- OB History  Gravidity:    9         Term:   3         Prem:   1        SAB:   3  TOP:          0       Ectopic:  1        Living: 4 ---------------------------------------------------------------------- Gestational Age  LMP:           33w 3d        Date:  02/11/19                 EDD:   11/18/19  Best:          33w 3d     Det. By:  LMP  (02/11/19)          EDD:   11/18/19 ---------------------------------------------------------------------- Comments  This patient was seen for a biophysical profile due to  isoimmunization and gestational diabetes that is currently  treated with Metformin.  The patient reports that she has been  possibly leaking fluid for the past few days.  A biophysical profile performed today was 8 out of 8.  The amniotic fluid level was in the lower normal range.  However, it has remained stable at  around 9 cm since last  week.  The patient was encouraged to get evaluated in the  MAU at the hospital should she continue to leak fluid.  She  reports that it is difficult for her to go to the hospital with 4  other children.  Another biophysical profile and MCA Doppler study was  scheduled in 1 week. ----------------------------------------------------------------------                   Johnell Comings, MD Electronically Signed Final Report   10/03/2019 09:27 am ----------------------------------------------------------------------  Review of Systems  Gastrointestinal: Negative for abdominal pain.  Genitourinary: Positive for flank pain and vaginal discharge. Negative for vaginal bleeding.   Physical Exam   Blood pressure 126/72, pulse 97, temperature 98.9 F (37.2 C), temperature source Oral, resp. rate 16, last menstrual period 02/11/2019, SpO2 99 %.  Physical Exam  Constitutional: She is oriented to person, place, and time. She appears well-developed and well-nourished. No distress.  HENT:  Head: Normocephalic.  Eyes: Pupils are equal, round, and reactive to light.  GI: Soft. Normal appearance. There is CVA tenderness (Bilateral  CVA tenderness ).  Musculoskeletal:        General: Normal range of motion.  Neurological: She is alert and oriented to person, place, and time.  Skin: Skin is warm. She is not diaphoretic.  Psychiatric: Her behavior is normal.   Fetal Tracing:  Baseline: 140 bpm Variability: Moderate  Accelerations: 15x15 Decelerations: None Toco: None  MAU Course  Procedures  None  MDM Pelvic exam performed in the office today Amnisure collected by RN: negative Urine culture pending UA dip urine- WNL In the office.  Moderate right hydronephrosis noted on renal US: discussed in detail normalcy in pregnancy, however may be contributing to flank pain. Ok to use warm compresses. She declines medication management for pain.   Assessment and Plan   A:  1. Hydronephrosis, right  2. Bilateral flank pain   3. [redacted] weeks gestation of pregnancy     P:  Discharge home in stable condition Warm compresses to flank area Return to MAU if symptoms worsen Urine culture pending   Gaby Harney, Artist Pais, NP 10/04/2019 7:28 PM

## 2019-10-05 ENCOUNTER — Encounter: Payer: Self-pay | Admitting: Obstetrics and Gynecology

## 2019-10-05 MED ORDER — METFORMIN HCL 500 MG PO TABS: 1500.0000 mg | ORAL_TABLET | Freq: Two times a day (BID) | ORAL | 5 refills | Status: DC

## 2019-10-05 NOTE — Addendum Note (Signed)
Addended by: Venia Carbon I on: 10/05/2019 01:43 PM   Modules accepted: Orders

## 2019-10-06 LAB — URINE CULTURE, OB REFLEX

## 2019-10-06 LAB — CULTURE, OB URINE

## 2019-10-09 ENCOUNTER — Encounter: Payer: Managed Care, Other (non HMO) | Admitting: Obstetrics & Gynecology

## 2019-10-10 ENCOUNTER — Other Ambulatory Visit (HOSPITAL_COMMUNITY): Payer: Self-pay | Admitting: Obstetrics and Gynecology

## 2019-10-10 ENCOUNTER — Ambulatory Visit (HOSPITAL_COMMUNITY)
Admission: RE | Admit: 2019-10-10 | Discharge: 2019-10-10 | Disposition: A | Discharge status: Home / Self Care | Payer: Managed Care, Other (non HMO) | Source: Ambulatory Visit | Attending: Obstetrics and Gynecology | Admitting: Obstetrics and Gynecology

## 2019-10-10 ENCOUNTER — Encounter (HOSPITAL_COMMUNITY): Payer: Self-pay

## 2019-10-10 ENCOUNTER — Other Ambulatory Visit: Payer: Self-pay

## 2019-10-10 ENCOUNTER — Ambulatory Visit (HOSPITAL_COMMUNITY): Payer: Managed Care, Other (non HMO) | Admitting: *Deleted

## 2019-10-10 DIAGNOSIS — O09213 Supervision of pregnancy with history of pre-term labor, third trimester: Secondary | ICD-10-CM

## 2019-10-10 DIAGNOSIS — O360130 Maternal care for anti-D [Rh] antibodies, third trimester, not applicable or unspecified: Secondary | ICD-10-CM | POA: Diagnosis not present

## 2019-10-10 DIAGNOSIS — O24415 Gestational diabetes mellitus in pregnancy, controlled by oral hypoglycemic drugs: Secondary | ICD-10-CM | POA: Diagnosis present

## 2019-10-10 DIAGNOSIS — D691 Qualitative platelet defects: Secondary | ICD-10-CM | POA: Diagnosis present

## 2019-10-10 DIAGNOSIS — Z3182 Encounter for Rh incompatibility status: Secondary | ICD-10-CM

## 2019-10-10 DIAGNOSIS — O09293 Supervision of pregnancy with other poor reproductive or obstetric history, third trimester: Secondary | ICD-10-CM

## 2019-10-10 DIAGNOSIS — Z3A34 34 weeks gestation of pregnancy: Secondary | ICD-10-CM

## 2019-10-11 ENCOUNTER — Other Ambulatory Visit (HOSPITAL_COMMUNITY): Payer: Self-pay | Admitting: *Deleted

## 2019-10-11 ENCOUNTER — Telehealth: Payer: Self-pay

## 2019-10-11 DIAGNOSIS — D696 Thrombocytopenia, unspecified: Secondary | ICD-10-CM

## 2019-10-11 DIAGNOSIS — O99119 Other diseases of the blood and blood-forming organs and certain disorders involving the immune mechanism complicating pregnancy, unspecified trimester: Secondary | ICD-10-CM

## 2019-10-11 NOTE — Telephone Encounter (Signed)
Patient called stating that she got a post meat gtt of 55. Patient states that's she is feeling ok not the best. Patient states she hasnt had much of an appetite. Patient states she ate the same thing for lunch yesterday and got 87 after lunch.  Patient had an apple and string cheese for lunch. Explained it would help if she added some protein to that. Patient states she can eat some pistashios now.  Agreed that might help raise it some. Also encouraged to eat a protein filled dinner. Patient made aware to call back if she is consistently getting lower readings after eating.  Patient states understanding. Armandina Stammer RN

## 2019-10-16 ENCOUNTER — Ambulatory Visit (INDEPENDENT_AMBULATORY_CARE_PROVIDER_SITE_OTHER): Payer: Managed Care, Other (non HMO) | Admitting: Obstetrics & Gynecology

## 2019-10-16 ENCOUNTER — Other Ambulatory Visit: Payer: Self-pay

## 2019-10-16 VITALS — BP 126/78 | HR 98 | Wt 172.0 lb

## 2019-10-16 DIAGNOSIS — J45909 Unspecified asthma, uncomplicated: Secondary | ICD-10-CM

## 2019-10-16 DIAGNOSIS — O09293 Supervision of pregnancy with other poor reproductive or obstetric history, third trimester: Secondary | ICD-10-CM

## 2019-10-16 DIAGNOSIS — Z3A35 35 weeks gestation of pregnancy: Secondary | ICD-10-CM

## 2019-10-16 DIAGNOSIS — M2550 Pain in unspecified joint: Secondary | ICD-10-CM

## 2019-10-16 DIAGNOSIS — O99513 Diseases of the respiratory system complicating pregnancy, third trimester: Secondary | ICD-10-CM

## 2019-10-16 DIAGNOSIS — Z049 Encounter for examination and observation for unspecified reason: Secondary | ICD-10-CM

## 2019-10-16 DIAGNOSIS — D696 Thrombocytopenia, unspecified: Secondary | ICD-10-CM

## 2019-10-16 DIAGNOSIS — O26893 Other specified pregnancy related conditions, third trimester: Secondary | ICD-10-CM

## 2019-10-16 DIAGNOSIS — O24415 Gestational diabetes mellitus in pregnancy, controlled by oral hypoglycemic drugs: Secondary | ICD-10-CM | POA: Diagnosis not present

## 2019-10-16 DIAGNOSIS — O99891 Other specified diseases and conditions complicating pregnancy: Secondary | ICD-10-CM

## 2019-10-16 DIAGNOSIS — O0990 Supervision of high risk pregnancy, unspecified, unspecified trimester: Secondary | ICD-10-CM

## 2019-10-16 DIAGNOSIS — R0602 Shortness of breath: Secondary | ICD-10-CM

## 2019-10-16 DIAGNOSIS — O99113 Other diseases of the blood and blood-forming organs and certain disorders involving the immune mechanism complicating pregnancy, third trimester: Secondary | ICD-10-CM

## 2019-10-16 MED ORDER — PREDNISONE 50 MG PO TABS: 50.0000 mg | ORAL_TABLET | Freq: Every day | ORAL | 1 refills | Status: AC

## 2019-10-16 NOTE — Progress Notes (Signed)
PRENATAL VISIT NOTE  Subjective:  Janice Hurley is a 34 y.o. V6X4503 at 29w2dbeing seen today for ongoing prenatal care.  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  Patient reports a myriad of complaints. Continued SOB that had been attributed to asthma, joint pain, body aches, flank pain, extreme exhaustion, headaches, vision problems, abdominal pain and not feeling well overall.  No fevers, nausea, GI or GU symptoms.  Contractions: Irritability. Vag. Bleeding: None.  Movement: Present. Denies leaking of fluid.   The following portions of the patient's history were reviewed and updated as appropriate: allergies, current medications, past family history, past medical history, past social history, past surgical history and problem list.   Objective:   Vitals:   10/16/19 1034  BP: 126/78  Pulse: 98  Weight: 172 lb (78 kg)    Fetal Status:     Movement: Present     General:  Alert, oriented and cooperative. Patient is in no acute distress.  Skin: Skin is warm and dry. No rash noted.   Cardiovascular: Normal heart rate noted  Respiratory: Normal respiratory effort, no problems with respiration noted  Abdomen: Soft, gravid, appropriate for gestational age.  Pain/Pressure: Present     Pelvic: Cervical exam deferred        Extremities: Normal range of motion.  Edema: Trace  Mental Status: Normal mood and affect. Normal behavior. Normal judgment and thought content.   Imaging: [place]NAL  Result  Date: 10/04/2019 CLINICAL DATA:  Bilateral flank pain for 2 days, history of left renal calculi, [redacted] weeks pregnant EXAM: RENAL / URINARY TRACT ULTRASOUND COMPLETE COMPARISON:  None. FINDINGS: Right Kidney: Renal measurements: 11.6 x 5.0 x 5.1 cm = volume: 166 mL. Echotexture is normal. There is a 1 cm simple cortical cyst in the interpolar region. There is moderate right hydronephrosis which may be functional given gravid uterus. No urinary tract calculi. Left Kidney: Renal measurements: 10.8 x 5.6 x 4.8 cm = volume: 152 mL. Echotexture is normal. No hydronephrosis or nephrolithiasis. Bladder: Appears normal for degree of bladder distention. Other: None. IMPRESSION: 1. Moderate right hydronephrosis, which could be functional given gravid uterus. 2. No evidence of nephrolithiasis. Electronically Signed   By: MRanda NgoM.D.   On: 10/04/2019 15:27   [place]FM FETAL BPP WO NON STRESS  Result Date: 10/10/2019 ----------------------------------------------------------------------  OBSTETRICS REPORT                       (Signed Final 10/10/2019 04:38 pm) ---------------------------------------------------------------------- Patient Info  ID #:       [id number]                         D.O.B.:  0Nov 13, 1987(34 yrs)  Name:       [name]                     Visit Date: 10/10/2019 03:44 pm ---------------------------------------------------------------------- Performed By  Performed By:     HValda Favia         Ref. Address:  [address]                                                             [place], [place]  Attending:        Tama High MD        [facility]  Referred By:      [facility] ---------------------------------------------------------------------- Orders   #  Description                          Code         Ordered By   1  [place] MFM FETAL BPP WO NON               76819.01     RAVI SHANKAR      STRESS   2  [place] MFM MCA DOPPLER                   76821.01     RAVI SHANKAR   3  [place] MFM OB FOLLOW UP                  76816.01     RAVI SHANKAR  ----------------------------------------------------------------------   #  Order #                    Accession #                 Episode #   1  [id number]                  [id number]                  [id number]   2  [id number]                  [id number]                  [id number]   3  [id number]                  [id number]                  [id number]  ---------------------------------------------------------------------- Indications   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,      O36.0130   third trimester, unspecified   [redacted] weeks gestation of pregnancy                Z3A.34   Gestational diabetes in pregnancy,             O24.415   controlled by oral hypoglycemic drugs   (metformin)   Poor obstetric history: Previous preterm       O09.219   delivery, antepartum (36+6 weeks)  Poor obstetric history: Previous gestational   O09.299   HTN  ---------------------------------------------------------------------- Fetal Evaluation  Num Of Fetuses:         1  Fetal Heart Rate(bpm):  165  Cardiac Activity:       Observed  Presentation:           Cephalic  Amniotic Fluid  AFI FV:      Within normal limits  AFI Sum(cm)     %Tile       Largest Pocket(cm)  13.13           43          4.53  RUQ(cm)       RLQ(cm)       LUQ(cm)        LLQ(cm)  4.53          2.67          2.72           3.21 ---------------------------------------------------------------------- Biophysical Evaluation  Amniotic F.V:   Within normal limits       F. Tone:        Observed  F. Movement:    Observed                   Score:          8/8  F. Breathing:   Observed ---------------------------------------------------------------------- Biometry  BPD:      82.7  mm     G. Age:  33w 2d         18  %    CI:        80.17   %    70 - 86                                                           FL/HC:      21.4   %    19.4 - 21.8  HC:      291.8  mm     G. Age:  32w 1d        < 1  %    HC/AC:      0.89        0.96 - 1.11  AC:       328   mm     G. Age:  36w 5d         97  %    FL/BPD:     75.3   %    71 - 87  FL:       62.3  mm     G. Age:  32w 2d          4  %    FL/AC:      19.0   %    20 - 24  Est. FW:    2499  gm      5 lb 8 oz     54  % ---------------------------------------------------------------------- OB History  Gravidity:    9         Term:   3        Prem:   1        SAB:   3  TOP:          0  Ectopic:  1        Living: 4 ---------------------------------------------------------------------- Gestational Age  LMP:           34w 3d        Date:  02/11/19                 EDD:   11/18/19  U/S Today:     33w 4d                                        EDD:   11/24/19  Best:          34w 3d     Det. By:  LMP  (02/11/19)          EDD:   11/18/19 ---------------------------------------------------------------------- Anatomy  Thoracic:              Appears normal         Bladder:                Appears normal  Stomach:               Appears normal, left                         sided ---------------------------------------------------------------------- Doppler - Fetal Vessels  Middle Cerebral Artery                                                     PSV    MoM                                                   (cm/s)                                                     55.6   1.14 ---------------------------------------------------------------------- Cervix Uterus Adnexa  Cervix  Not visualized (advanced GA >24wks) ---------------------------------------------------------------------- Impression  Patient returns for fetal growth assessment, BPP and MCA  Doppler studies.  She has gestational diabetes and takes  Metformin 1000 mg in the morning and 500 mg in the  evening.  She reports her blood glucose levels are now within  normal range.  Antibody titers were 1:1 following RhoGam injection and   patient had a consultation with me last month.  She also has  gestational thrombocytopenia and the most recent platelet  count was 97K.  On today's ultrasound, fetal growth is appropriate for  gestational age.  Amniotic fluid is normal and good fetal  activity seen.  Antenatal testing is reassuring.  BPP 8/8.  Middle cerebral artery showed normal peak systolic velocity  measurements (no evidence of fetal anemia).  Patient is slightly anxious about the estimated fetal weight  and informed that this is unusually large for her.  I reassured  her that there is still no evidence of macrosomia.  I also  informed her of the limitations of ultrasound and accurately  estimating  fetal weights.  Her blood pressures today at our office or 147/72 and 138/59  mmHg. ---------------------------------------------------------------------- Recommendations  -NST at your office next week.  -BPP, MCA Doppler in 2 weeks.  -BPP, MCA Doppler and fetal growth in 3 weeks. ----------------------------------------------------------------------                  Tama High, MD Electronically Signed Final Report   10/10/2019 04:38 pm ----------------------------------------------------------------------  [place] MFM FETAL BPP WO NON STRESS  Result Date: 10/03/2019 ----------------------------------------------------------------------  OBSTETRICS REPORT                       (Signed Final 10/03/2019 09:27 am) ---------------------------------------------------------------------- Patient Info  ID #:       [id number]                          D.O.B.:  Jul 13, 1985 (34 yrs)  Name:       Janice Hurley                      Visit Date: 10/03/2019 08:00 am ---------------------------------------------------------------------- Performed By  Performed By:     Jeanene Erb BS,      Ref. Address:     [address] [place]                                                             [place], [place]  Attending:        Johnell Comings MD          [facility]  Referred By:      [facility] ---------------------------------------------------------------------- Orders   #  Description                          Code         Ordered By   1  [place] MFM FETAL BPP WO NON              59292.44     RAVI [facility]      STRESS  ----------------------------------------------------------------------   #  Order #                    Accession #                 Episode #   1  [id number]                  [id number]                  [id number]  ---------------------------------------------------------------------- Indications   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,      O36.0130   third trimester, unspecified  Gestational diabetes in pregnancy,             O24.415   controlled by oral hypoglycemic drugs   (metformin)   Poor obstetric history: Previous preterm       O09.219   delivery, antepartum (36+6 weeks)   Poor obstetric history: Previous gestational   O34.299   HTN   [redacted] weeks gestation of pregnancy                Z3A.33  ---------------------------------------------------------------------- Fetal Evaluation  Num Of Fetuses:         1  Fetal Heart Rate(bpm):  130  Cardiac Activity:       Observed  Presentation:           Cephalic  Placenta:               Posterior  P. Cord Insertion:      Previously Visualized  AFI Sum(cm)     %Tile       Largest Pocket(cm)  8.7             9           3.61  RUQ(cm)                     LUQ(cm)        LLQ(cm)  1.86                        3.23           3.61 ---------------------------------------------------------------------- Biophysical Evaluation  Amniotic F.V:   Pocket => 2 cm             F. Tone:        Observed  F. Movement:    Observed                   Score:          8/8  F. Breathing:   Observed ---------------------------------------------------------------------- OB History  Gravidity:    9         Term:   3        Prem:   1         SAB:   3  TOP:          0       Ectopic:  1        Living: 4 ---------------------------------------------------------------------- Gestational Age  LMP:           33w 3d        Date:  02/11/19                 EDD:   11/18/19  Best:          33w 3d     Det. By:  LMP  (02/11/19)          EDD:   11/18/19 ---------------------------------------------------------------------- Comments  This patient was seen for a biophysical profile due to  isoimmunization and gestational diabetes that is currently  treated with Metformin.  The patient reports that she has been  possibly leaking fluid for the past few days.  A biophysical profile performed today was 8 out of 8.  The amniotic fluid level was in the lower normal range.  However, it has remained stable at  around 9 cm since last  week.  The patient was encouraged to get evaluated in the  MAU at the hospital should she continue to leak fluid.  She  reports that it is difficult for her to go to the hospital with 4  other children.  Another biophysical profile and MCA Doppler study was  scheduled in 1 week. ----------------------------------------------------------------------                   Johnell Comings, MD Electronically Signed Final Report   10/03/2019 09:27 am ----------------------------------------------------------------------  [place] MFM FETAL BPP WO NON STRESS  Result Date: 09/27/2019 ----------------------------------------------------------------------  OBSTETRICS REPORT                       (Signed Final 09/27/2019 04:47 pm) ---------------------------------------------------------------------- Patient Info  ID #:       [id number]                          D.O.B.:  [birth year] (34 yrs)  Name:       Janice Hurley                      Visit Date: 09/27/2019 04:20 pm ---------------------------------------------------------------------- Performed By  Performed By:     Vianne Bulls Tester BS,       Ref. Address:     [address] [place]                                                              [place], [place]  Attending:        Johnell Comings MD         [facility]  Referred By:      [facility] ---------------------------------------------------------------------- Orders   #  Description                          Code         Ordered By   1  [place] MFM FETAL BPP WO NON              76819.01     [place]   2  [place] MFM MCA DOPPLER                   50037.04     RAVI [facility]  ----------------------------------------------------------------------   #  Order #                    Accession #                 Episode #   1  [id number]                  [id number]  [id number]   2  [id number]                  [id number]                  [id number]  ---------------------------------------------------------------------- Indications   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,      O36.0130   third trimester, unspecified   Gestational diabetes in pregnancy,             O24.415   controlled by oral hypoglycemic drugs   (metformin)   [redacted] weeks gestation of pregnancy                Z3A.39   Antenatal screening for malformations          Z36.3   Poor obstetric history: Previous preterm       O09.219   delivery, antepartum (36+6 weeks)   Poor obstetric history: Previous gestational   O09.299   HTN  ---------------------------------------------------------------------- Fetal Evaluation  Num Of Fetuses:         1  Fetal Heart Rate(bpm):  142  Cardiac Activity:       Observed  Placenta:               Posterior  P. Cord Insertion:      Previously Visualized  Amniotic Fluid  AFI FV:      Within normal limits  AFI Sum(cm)     %Tile       Largest Pocket(cm)  9.89            16          4.25  RUQ(cm)       RLQ(cm)       LUQ(cm)        LLQ(cm)  4.25          2.2           2.11           1.33 ---------------------------------------------------------------------- Biophysical  Evaluation  Amniotic F.V:   Pocket => 2 cm             F. Tone:        Observed  F. Movement:    Observed                   Score:          8/8  F. Breathing:   Observed ---------------------------------------------------------------------- Biometry  LV:        5.5  mm ---------------------------------------------------------------------- OB History  Gravidity:    9         Term:   3        Prem:   1        SAB:   3  TOP:          0       Ectopic:  1        Living: 4 ---------------------------------------------------------------------- Gestational Age  LMP:           32w 4d        Date:  02/11/19                 EDD:   11/18/19  Best:          Milderd Meager 4d     Det. By:  LMP  (02/11/19)          EDD:   11/18/19 ---------------------------------------------------------------------- Doppler - Fetal Vessels  Middle Cerebral Artery  PSV    MoM                                                   (cm/s)                                                     59.4   1.32 ---------------------------------------------------------------------- Cervix Uterus Adnexa  Cervix  Not visualized (advanced GA >24wks)  Uterus  No abnormality visualized.  Left Ovary  Within normal limits. No adnexal mass visualized.  Right Ovary  Within normal limits. No adnexal mass visualized.  Cul De Sac  No free fluid seen.  Adnexa  No abnormality visualized. ---------------------------------------------------------------------- Comments  This patient was seen for a biophysical profile due to  gestational diabetes currently treated with Metformin.  She  was also seen for MCA Doppler studies due to  isoimmunization.  She denies any problems since her last  exam.  A biophysical profile performed today was 8 out of 8.  There was normal amniotic fluid noted on today's ultrasound  exam.  Doppler studies of the middle cerebral arteries performed due  to isoimmunization showed a peak systolic velocity of 58.8  cm/s  which is 1.32 MoM for her gestational age, indicating  that her fetus is not anemic at this time.  There were no signs  of fetal hydrops noted today.  Another biophysical profile was scheduled in 1 week. ----------------------------------------------------------------------                   Johnell Comings, MD Electronically Signed Final Report   09/27/2019 04:47 pm ----------------------------------------------------------------------  [place] MFM MCA DOPPLER  Result Date: 10/10/2019 ----------------------------------------------------------------------  OBSTETRICS REPORT                       (Signed Final 10/10/2019 04:38 pm) ---------------------------------------------------------------------- Patient Info  ID #:       [id number]                          D.O.B.:  06-24-1985 (34 yrs)  Name:       Janice Hurley                      Visit Date: 10/10/2019 03:44 pm ---------------------------------------------------------------------- Performed By  Performed By:     Valda Favia          Ref. Address:     [address] [place], [place]  Attending:        Tama High MD        [facility]  Referred By:      Willene Hatchet ---------------------------------------------------------------------- Orders   #  Description                          Code         Ordered By   1  [place] MFM FETAL BPP WO NON              76819.01     RAVI SHANKAR      STRESS   2  [place] MFM MCA DOPPLER                   76821.01     RAVI SHANKAR   3  [place] MFM OB FOLLOW UP                  76816.01     RAVI SHANKAR  ----------------------------------------------------------------------   #  Order #                    Accession #                 Episode #   1  [id number]                  [id number]                  [id number]   2  [id number]                  [id number]                   [id number]   3  [id number]                  [id number]                  [id number]  ---------------------------------------------------------------------- Indications   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,      O36.0130   third trimester, unspecified   [redacted] weeks gestation of pregnancy                Z3A.34   Gestational diabetes in pregnancy,             O24.415   controlled by oral hypoglycemic drugs   (metformin)   Poor obstetric history: Previous preterm       O09.219   delivery, antepartum (36+6 weeks)   Poor obstetric history: Previous gestational   O09.299   HTN  ---------------------------------------------------------------------- Fetal Evaluation  Num Of Fetuses:         1  Fetal Heart Rate(bpm):  165  Cardiac Activity:       Observed  Presentation:           Cephalic  Amniotic Fluid  AFI FV:      Within normal limits  AFI Sum(cm)     %Tile       Largest Pocket(cm)  13.13           43          4.53  RUQ(cm)       RLQ(cm)       LUQ(cm)        LLQ(cm)  4.53          2.67          2.72           3.21 ---------------------------------------------------------------------- Biophysical Evaluation  Amniotic F.V:   Within  normal limits       F. Tone:        Observed  F. Movement:    Observed                   Score:          8/8  F. Breathing:   Observed ---------------------------------------------------------------------- Biometry  BPD:      82.7  mm     G. Age:  33w 2d         18  %    CI:        80.17   %    70 - 86                                                          FL/HC:      21.4   %    19.4 - 21.8  HC:      291.8  mm     G. Age:  32w 1d        < 1  %    HC/AC:      0.89        0.96 - 1.11  AC:       328   mm     G. Age:  36w 5d         97  %    FL/BPD:     75.3   %    71 - 87  FL:       62.3  mm     G. Age:  32w 2d          4  %    FL/AC:      19.0   %    20 - 24  Est. FW:    2499  gm      5 lb 8 oz     54  % ---------------------------------------------------------------------- OB History   Gravidity:    9         Term:   3        Prem:   1        SAB:   3  TOP:          0       Ectopic:  1        Living: 4 ---------------------------------------------------------------------- Gestational Age  LMP:           34w 3d        Date:  02/11/19                 EDD:   11/18/19  U/S Today:     33w 4d                                        EDD:   11/24/19  Best:          34w 3d     Det. By:  LMP  (02/11/19)          EDD:   11/18/19 ---------------------------------------------------------------------- Anatomy  Thoracic:              Appears normal         Bladder:  Appears normal  Stomach:               Appears normal, left                         sided ---------------------------------------------------------------------- Doppler - Fetal Vessels  Middle Cerebral Artery                                                     PSV    MoM                                                   (cm/s)                                                     55.6   1.14 ---------------------------------------------------------------------- Cervix Uterus Adnexa  Cervix  Not visualized (advanced GA >24wks) ---------------------------------------------------------------------- Impression  Patient returns for fetal growth assessment, BPP and MCA  Doppler studies.  She has gestational diabetes and takes  Metformin 1000 mg in the morning and 500 mg in the  evening.  She reports her blood glucose levels are now within  normal range.  Antibody titers were 1:1 following RhoGam injection and  patient had a consultation with me last month.  She also has  gestational thrombocytopenia and the most recent platelet  count was 97K.  On today's ultrasound, fetal growth is appropriate for  gestational age.  Amniotic fluid is normal and good fetal  activity seen.  Antenatal testing is reassuring.  BPP 8/8.  Middle cerebral artery showed normal peak systolic velocity  measurements (no evidence of fetal anemia).  Patient is slightly  anxious about the estimated fetal weight  and informed that this is unusually large for her.  I reassured  her that there is still no evidence of macrosomia.  I also  informed her of the limitations of ultrasound and accurately  estimating fetal weights.  Her blood pressures today at our office or 147/72 and 138/59  mmHg. ---------------------------------------------------------------------- Recommendations  -NST at your office next week.  -BPP, MCA Doppler in 2 weeks.  -BPP, MCA Doppler and fetal growth in 3 weeks. ----------------------------------------------------------------------                  Tama High, MD Electronically Signed Final Report   10/10/2019 04:38 pm ----------------------------------------------------------------------  [place] MFM MCA DOPPLER  Result Date: 09/27/2019 ----------------------------------------------------------------------  OBSTETRICS REPORT                       (Signed Final 09/27/2019 04:47 pm) ---------------------------------------------------------------------- Patient Info  ID #:       [id number]                          D.O.B.:  [birth year] (34 yrs)  Name:       Janice Hurley                      Visit Date: 09/27/2019  04:20 pm ---------------------------------------------------------------------- Performed By  Performed By:     Jacob Moores BS,       Ref. Address:     [place]                                                             [place], [place]  Attending:        Johnell Comings MD         [facility]  Referred By:      [facility] ---------------------------------------------------------------------- Orders   #  Description                          Code         Ordered By   1  [place] MFM FETAL BPP WO NON              76819.01     [place]   2  [place] MFM MCA DOPPLER                   76821.01     [facility]   ----------------------------------------------------------------------   #  Order #                    Accession #                 Episode #   1  [id number]                  [id number]   2  [id number]                  [id number]                  [id number]  ---------------------------------------------------------------------- Indications   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,      O36.0130   third trimester, unspecified   Gestational diabetes in pregnancy,             O24.415   controlled by oral hypoglycemic drugs   (metformin)   [redacted] weeks gestation of pregnancy                Z3A.63   Antenatal screening for malformations          Z36.3   Poor obstetric history: Previous preterm       O09.219   delivery, antepartum (36+6 weeks)   Poor obstetric history: Previous gestational   O09.299   HTN  ---------------------------------------------------------------------- Fetal Evaluation  Num Of Fetuses:         1  Fetal Heart Rate(bpm):  142  Cardiac Activity:       Observed  Placenta:               Posterior  P. Cord Insertion:      Previously Visualized  Amniotic Fluid  AFI FV:      Within normal limits  AFI Sum(cm)     %Tile       Largest Pocket(cm)  9.89            16          4.25  RUQ(cm)       RLQ(cm)       LUQ(cm)        LLQ(cm)  4.25          2.2           2.11           1.33 ---------------------------------------------------------------------- Biophysical Evaluation  Amniotic F.V:   Pocket => 2 cm             F. Tone:        Observed  F. Movement:    Observed                   Score:          8/8  F. Breathing:   Observed ---------------------------------------------------------------------- Biometry  LV:        5.5  mm ---------------------------------------------------------------------- OB History  Gravidity:    9         Term:   3        Prem:   1        SAB:   3  TOP:          0       Ectopic:  1        Living: 4  ---------------------------------------------------------------------- Gestational Age  LMP:           32w 4d        Date:  02/11/19                 EDD:   11/18/19  Best:          Milderd Meager 4d     Det. By:  LMP  (02/11/19)          EDD:   11/18/19 ---------------------------------------------------------------------- Doppler - Fetal Vessels  Middle Cerebral Artery                                                     PSV    MoM                                                   (cm/s)                                                     59.4   1.32 ---------------------------------------------------------------------- Cervix Uterus Adnexa  Cervix  Not visualized (advanced GA >24wks)  Uterus  No abnormality visualized.  Left Ovary  Within normal limits.  No adnexal mass visualized.  Right Ovary  Within normal limits. No adnexal mass visualized.  Cul De Sac  No free fluid seen.  Adnexa  No abnormality visualized. ---------------------------------------------------------------------- Comments  This patient was seen for a biophysical profile due to  gestational diabetes currently treated with Metformin.  She  was also seen for MCA Doppler studies due to  isoimmunization.  She denies any problems since her last  exam.  A biophysical profile performed today was 8 out of 8.  There was normal amniotic fluid noted on today's ultrasound  exam.  Doppler studies of the middle cerebral arteries performed due  to isoimmunization showed a peak systolic velocity of 38.3  cm/s which is 1.32 MoM for her gestational age, indicating  that her fetus is not anemic at this time.  There were no signs  of fetal hydrops noted today.  Another biophysical profile was scheduled in 1 week. ----------------------------------------------------------------------                   Johnell Comings, MD Electronically Signed Final Report   09/27/2019 04:47 pm ----------------------------------------------------------------------  [place] MFM OB FOLLOW UP  Result  Date: 10/10/2019 ----------------------------------------------------------------------  OBSTETRICS REPORT                       (Signed Final 10/10/2019 04:38 pm) ---------------------------------------------------------------------- Patient Info  ID #:       [id number]                          D.O.B.:  08-Apr-1986 (34 yrs)  Name:       Janice Hurley                      Visit Date: 10/10/2019 03:44 pm ---------------------------------------------------------------------- Performed By  Performed By:     Valda Favia          Ref. Address:     [address] [place]                                                             [place], [place]  Attending:        Tama High MD        [facility]  Referred By:      Willene Hatchet ---------------------------------------------------------------------- Orders   #  Description                          Code         Ordered By   1  [place] MFM FETAL BPP WO NON              76819.01     [facility]  STRESS   2  [place] MFM MCA DOPPLER                   76821.01     [facility]   3  [place] MFM OB FOLLOW UP                  [id number]     RAVI SHANKAR  ----------------------------------------------------------------------   #  Order #                    Accession #                 Episode #   1  [id number]                  [id number]   2  [id number]                  [id number]                  [id number]   3  [id number]                  [id number]                  [id number]  ---------------------------------------------------------------------- Indications   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,      O36.0130   third trimester, unspecified   [redacted] weeks gestation of pregnancy                Z3A.34   Gestational diabetes in pregnancy,             O24.415   controlled by oral hypoglycemic drugs   (metformin)   Poor obstetric history: Previous preterm        O09.219   delivery, antepartum (36+6 weeks)   Poor obstetric history: Previous gestational   O09.299   HTN  ---------------------------------------------------------------------- Fetal Evaluation  Num Of Fetuses:         1  Fetal Heart Rate(bpm):  165  Cardiac Activity:       Observed  Presentation:           Cephalic  Amniotic Fluid  AFI FV:      Within normal limits  AFI Sum(cm)     %Tile       Largest Pocket(cm)  13.13           43          4.53  RUQ(cm)       RLQ(cm)       LUQ(cm)        LLQ(cm)  4.53          2.67          2.72           3.21 ---------------------------------------------------------------------- Biophysical Evaluation  Amniotic F.V:   Within normal limits       F. Tone:        Observed  F. Movement:    Observed                   Score:          8/8  F. Breathing:   Observed ---------------------------------------------------------------------- Biometry  BPD:      82.7  mm     G. Age:  33w 2d         18  %  CI:        80.17   %    70 - 86                                                          FL/HC:      21.4   %    19.4 - 21.8  HC:      291.8  mm     G. Age:  32w 1d        < 1  %    HC/AC:      0.89        0.96 - 1.11  AC:       328   mm     G. Age:  36w 5d         97  %    FL/BPD:     75.3   %    71 - 87  FL:       62.3  mm     G. Age:  32w 2d          4  %    FL/AC:      19.0   %    20 - 24  Est. FW:    2499  gm      5 lb 8 oz     54  % ---------------------------------------------------------------------- OB History  Gravidity:    9         Term:   3        Prem:   1        SAB:   3  TOP:          0       Ectopic:  1        Living: 4 ---------------------------------------------------------------------- Gestational Age  LMP:           34w 3d        Date:  02/11/19                 EDD:   11/18/19  U/S Today:     33w 4d                                        EDD:   11/24/19  Best:          34w 3d     Det. By:  LMP  (02/11/19)          EDD:   11/18/19  ---------------------------------------------------------------------- Anatomy  Thoracic:              Appears normal         Bladder:                Appears normal  Stomach:               Appears normal, left                         sided ---------------------------------------------------------------------- Doppler - Fetal Vessels  Middle Cerebral Artery  PSV    MoM                                                   (cm/s)                                                     55.6   1.14 ---------------------------------------------------------------------- Cervix Uterus Adnexa  Cervix  Not visualized (advanced GA >24wks) ---------------------------------------------------------------------- Impression  Patient returns for fetal growth assessment, BPP and MCA  Doppler studies.  She has gestational diabetes and takes  Metformin 1000 mg in the morning and 500 mg in the  evening.  She reports her blood glucose levels are now within  normal range.  Antibody titers were 1:1 following RhoGam injection and  patient had a consultation with me last month.  She also has  gestational thrombocytopenia and the most recent platelet  count was 97K.  On today's ultrasound, fetal growth is appropriate for  gestational age.  Amniotic fluid is normal and good fetal  activity seen.  Antenatal testing is reassuring.  BPP 8/8.  Middle cerebral artery showed normal peak systolic velocity  measurements (no evidence of fetal anemia).  Patient is slightly anxious about the estimated fetal weight  and informed that this is unusually large for her.  I reassured  her that there is still no evidence of macrosomia.  I also  informed her of the limitations of ultrasound and accurately  estimating fetal weights.  Her blood pressures today at our office or 147/72 and 138/59  mmHg. ---------------------------------------------------------------------- Recommendations  -NST at your office next week.   -BPP, MCA Doppler in 2 weeks.  -BPP, MCA Doppler and fetal growth in 3 weeks. ----------------------------------------------------------------------                  Tama High, MD Electronically Signed Final Report   10/10/2019 04:38 pm ----------------------------------------------------------------------   Assessment and Plan:  Pregnancy: [id number] at 16w2d1. Gestational diabetes mellitus (GDM) in third trimester controlled on oral hypoglycemic drug Reviewed blood sugars, all within range. Continue Metformin - Fetal nonstress test performed today in office was reviewed and was found to be reactive.  Continue recommended antenatal testing and prenatal care. - AFI done today in office by me, was 12 cm. Observed good fetal movement and breathing during the scan.  2. Shortness of breath due to pregnancy in third trimester 3. Asthma affecting pregnancy in third trimester Alleviated by IM steroids in past. Will try steroid burst, continue albuterol inhaler as needed.  - predniSONE (DELTASONE) 50 MG tablet; Take 1 tablet (50 mg total) by mouth daily with breakfast for 5 days.  Dispense: 5 tablet; Refill: 1  4. Evaluation for suspected condition given concerning symptoms Patient reported joint pain even prior to pregnancy; and also has low platelets, SOB. Also has a host of other symptoms of unclear etiology. Could also be gestational but also concerned about SLE or other autoimmune etiology. Will check ANA and RF and may need subsequent labs based on these results. - ANA - Rheumatoid Factor  5. Supervision of high risk pregnancy, antepartum Preterm labor symptoms and general obstetric precautions including but not limited to vaginal bleeding, contractions, leaking  of fluid and fetal movement were reviewed in detail with the patient. Please refer to After Visit Summary for other counseling recommendations.   Return in about 1 week (around 10/23/2019) for OFFICE OB Visit.   Future Appointments    Date Time Provider [place]  10/23/2019  9:50 AM Guss Bunde, MD [facility]  10/27/2019  3:45 PM [facility] NURSE [facility]  10/27/2019  3:45 PM [facility] US5 [facility]  11/03/2019  3:45 PM [facility] NURSE [facility]  11/03/2019  3:45 PM [facility] US5 [facility] [place]    Verita Schneiders, MD

## 2019-10-16 NOTE — Patient Instructions (Signed)
Return to office for any scheduled appointments. Call the office or go to the MAU at [facility] if:  You begin to have strong, frequent contractions  Your water breaks.  Sometimes it is a big gush of fluid, sometimes it is just a trickle that keeps getting your panties wet or running down your legs  You have vaginal bleeding.  It is normal to have a small amount of spotting if your cervix was checked.   You do not feel your baby moving like normal.  If you do not, get something to eat and drink and lay down and focus on feeling your baby move.   If your baby is still not moving like normal, you should call the office or go to MAU.  Any other obstetric concerns.   

## 2019-10-16 NOTE — Progress Notes (Signed)
AFI: 12  Performed by Dr. Macon Large. Armandina Stammer RN

## 2019-10-17 ENCOUNTER — Telehealth: Payer: Self-pay

## 2019-10-17 LAB — RHEUMATOID FACTOR: Rheumatoid fact SerPl-aCnc: <14 IU/mL (ref <14)

## 2019-10-17 LAB — ANA: Anti Nuclear Antibody (ANA): NEGATIVE

## 2019-10-17 NOTE — Telephone Encounter (Addendum)
Spoke with pt and she is choosing to stop taking the Prednisone.    ----- Message from Tereso Newcomer, MD sent at 10/17/2019 11:24 AM EDT ----- Regarding: RE: Medication It can do that. It is a known side effect.   If she is worried, she can stop the steroids but this is a known side effect of the medication. No intervention is needed unless the values get too high.  Thank you!  ----- Message ----- From: Kathie Dike, CMA Sent: 10/17/2019  11:06 AM EDT To: Tereso Newcomer, MD Subject: Medication                                     Hey Dr.A,  You prescribed Prednisone for this pt yesterday. She called and says it is affecting her blood sugars. Can you please send her a MyChart message addressing this?  Thanks

## 2019-10-23 ENCOUNTER — Ambulatory Visit (INDEPENDENT_AMBULATORY_CARE_PROVIDER_SITE_OTHER): Payer: Managed Care, Other (non HMO) | Admitting: Obstetrics & Gynecology

## 2019-10-23 ENCOUNTER — Other Ambulatory Visit: Payer: Self-pay

## 2019-10-23 ENCOUNTER — Other Ambulatory Visit (HOSPITAL_COMMUNITY)
Admission: RE | Admit: 2019-10-23 | Discharge: 2019-10-23 | Disposition: A | Discharge status: Home / Self Care | Payer: Managed Care, Other (non HMO) | Source: Ambulatory Visit | Attending: Obstetrics & Gynecology | Admitting: Obstetrics & Gynecology

## 2019-10-23 VITALS — BP 124/74 | HR 93 | Temp 98.7°F | Wt 174.0 lb

## 2019-10-23 DIAGNOSIS — O0990 Supervision of high risk pregnancy, unspecified, unspecified trimester: Secondary | ICD-10-CM | POA: Diagnosis not present

## 2019-10-23 DIAGNOSIS — O2623 Pregnancy care for patient with recurrent pregnancy loss, third trimester: Secondary | ICD-10-CM | POA: Diagnosis not present

## 2019-10-23 DIAGNOSIS — O24415 Gestational diabetes mellitus in pregnancy, controlled by oral hypoglycemic drugs: Secondary | ICD-10-CM | POA: Diagnosis not present

## 2019-10-23 DIAGNOSIS — Z3A36 36 weeks gestation of pregnancy: Secondary | ICD-10-CM

## 2019-10-23 DIAGNOSIS — Z8759 Personal history of other complications of pregnancy, childbirth and the puerperium: Secondary | ICD-10-CM

## 2019-10-23 DIAGNOSIS — O2620 Pregnancy care for patient with recurrent pregnancy loss, unspecified trimester: Secondary | ICD-10-CM

## 2019-10-23 DIAGNOSIS — O0993 Supervision of high risk pregnancy, unspecified, third trimester: Secondary | ICD-10-CM

## 2019-10-23 LAB — OB RESULTS CONSOLE GBS: GBS: NEGATIVE

## 2019-10-23 NOTE — Progress Notes (Signed)
Patient ID: Janice Hurley, female   DOB: November 21, 1985, 34 y.o.   MRN: [id number]    PRENATAL VISIT NOTE  Subjective:  Janice Hurley is a 34 y.o. [id number] at [redacted]w[redacted]d being seen today for ongoing prenatal care.  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  Patient reports fatigue and upper abdominal pain, decreased urine stream, headaches (none today), worried about how she feels.  Contractions: Irritability. Vag. Bleeding: None.  Movement: Present. Denies leaking of fluid.   The following portions of the patient's history were reviewed and updated as appropriate: allergies, current medications, past family history, past medical history, past social history, past surgical history and problem list.   Objective:   Vitals:   10/23/19 0950  BP: 124/74  Pulse: 93  Temp: 98.7 F (37.1 C)  Weight: 174 lb (78.9 kg)    Fetal Status: Fetal Heart Rate (bpm): 154   Movement: Present     General:  Alert, oriented and cooperative. Patient is in no acute distress.  Skin: Skin is warm and dry. No rash noted.   Cardiovascular: Normal heart rate noted  Respiratory: Normal respiratory effort, no problems with respiration noted  Abdomen: Soft, gravid, appropriate for gestational age.  Pain/Pressure: Present     Pelvic: Cervical exam performed in the presence of a chaperone     1/50/-3  Extremities: Normal range of motion.  Edema: Trace  Mental Status: Normal mood and affect. Normal behavior. Normal judgment and thought  content.   Assessment and Plan:  Pregnancy: [id number] at [redacted]w[redacted]d 1. Supervision of high risk pregnancy, antepartum - Culture, beta strep (group b only) - Cervicovaginal ancillary only( [place])  2. History of pregnancy induced hypertension/CHTN Recommendations  -NST at your office next week.  -BPP, MCA Doppler in 2 weeks.  -BPP, MCA Doppler and fetal growth in 3 weeks. ----------------------------------------------------------------------                  Noralee Space, MD Electronically Signed Final Report   10/10/2019 04:38 pm PIH labs drawn and UA sent  3. Gestational diabetes mellitus (GDM) in third trimester controlled on oral hypoglycemic drug CBGs went up with prednisone burst so she quit.  CBGs now normal.    4. Asthma Pt still feels SOB.  No wheezing.  Pt trying to get appt with her pulmonologist at [facility].    Term labor symptoms and general obstetric precautions including but not limited to vaginal bleeding, contractions, leaking of fluid and fetal movement were reviewed in detail with the patient. Please refer to After Visit Summary for other counseling recommendations.   No follow-ups on file.  Future Appointments  Date Time Provider Department Center  10/27/2019  3:45 PM [facility] NURSE [facility]  10/27/2019  3:45 PM [facility] US5 [facility]  11/03/2019  3:45 PM [facility] NURSE [facility]  11/03/2019  3:45 PM [facility] US5 [facility]    Elsie Lincoln, MD

## 2019-10-24 LAB — URINALYSIS
Bilirubin Urine: NEGATIVE
Glucose, UA: NEGATIVE
Hgb urine dipstick: NEGATIVE
Ketones, ur: NEGATIVE
Leukocytes,Ua: NEGATIVE
Nitrite: NEGATIVE
Protein, ur: NEGATIVE
Specific Gravity, Urine: 1.014 (ref 1.001–1.03)
pH: 6.5 (ref 5.0–8.0)

## 2019-10-24 LAB — CBC
HCT: 39.1 % (ref 35.0–45.0)
Hemoglobin: 13.4 g/dL (ref 11.7–15.5)
MCH: 32.4 pg (ref 27.0–33.0)
MCHC: 34.3 g/dL (ref 32.0–36.0)
MCV: 94.4 fL (ref 80.0–100.0)
MPV: 11.3 fL (ref 7.5–12.5)
Platelets: 120 10*3/uL — ABNORMAL LOW (ref 140–400)
RBC: 4.14 10*6/uL (ref 3.80–5.10)
RDW: 12.6 % (ref 11.0–15.0)
WBC: 11.5 10*3/uL — ABNORMAL HIGH (ref 3.8–10.8)

## 2019-10-24 LAB — COMPREHENSIVE METABOLIC PANEL
AG Ratio: 2 (calc) (ref 1.0–2.5)
ALT: 17 U/L (ref 6–29)
AST: 17 U/L (ref 10–30)
Albumin: 3.9 g/dL (ref 3.6–5.1)
Alkaline phosphatase (APISO): 78 U/L (ref 31–125)
BUN: 13 mg/dL (ref 7–25)
CO2: 21 mmol/L (ref 20–32)
Calcium: 8.9 mg/dL (ref 8.6–10.2)
Chloride: 106 mmol/L (ref 98–110)
Creat: 0.57 mg/dL (ref 0.50–1.10)
Globulin: 2 g/dL (calc) (ref 1.9–3.7)
Glucose, Bld: 83 mg/dL (ref 65–99)
Potassium: 3.9 mmol/L (ref 3.5–5.3)
Sodium: 140 mmol/L (ref 135–146)
Total Bilirubin: 0.4 mg/dL (ref 0.2–1.2)
Total Protein: 5.9 g/dL — ABNORMAL LOW (ref 6.1–8.1)

## 2019-10-24 LAB — PROTEIN / CREATININE RATIO, URINE
Creatinine, Urine: 51 mg/dL (ref 20–275)
Protein/Creat Ratio: 176 mg/g creat — ABNORMAL HIGH (ref 21–161)
Protein/Creatinine Ratio: 0.176 mg/mg creat — ABNORMAL HIGH (ref 0.021–0.16)
Total Protein, Urine: 9 mg/dL (ref 5–24)

## 2019-10-24 LAB — CERVICOVAGINAL ANCILLARY ONLY
Chlamydia: NEGATIVE
Comment: NEGATIVE
Comment: NORMAL
Neisseria Gonorrhea: NEGATIVE

## 2019-10-26 ENCOUNTER — Other Ambulatory Visit: Payer: Self-pay

## 2019-10-26 ENCOUNTER — Ambulatory Visit: Payer: Managed Care, Other (non HMO) | Attending: Internal Medicine

## 2019-10-26 DIAGNOSIS — Z20822 Contact with and (suspected) exposure to covid-19: Secondary | ICD-10-CM

## 2019-10-26 LAB — CULTURE, BETA STREP (GROUP B ONLY)
MICRO NUMBER:: 10460359
SPECIMEN QUALITY:: ADEQUATE

## 2019-10-27 ENCOUNTER — Ambulatory Visit: Payer: Managed Care, Other (non HMO) | Admitting: *Deleted

## 2019-10-27 ENCOUNTER — Other Ambulatory Visit: Payer: Self-pay | Admitting: Family Medicine

## 2019-10-27 ENCOUNTER — Encounter: Payer: Self-pay | Admitting: *Deleted

## 2019-10-27 ENCOUNTER — Ambulatory Visit (HOSPITAL_BASED_OUTPATIENT_CLINIC_OR_DEPARTMENT_OTHER): Payer: Managed Care, Other (non HMO)

## 2019-10-27 ENCOUNTER — Other Ambulatory Visit: Payer: Self-pay | Admitting: Advanced Practice Midwife

## 2019-10-27 DIAGNOSIS — O99119 Other diseases of the blood and blood-forming organs and certain disorders involving the immune mechanism complicating pregnancy, unspecified trimester: Secondary | ICD-10-CM | POA: Insufficient documentation

## 2019-10-27 DIAGNOSIS — D696 Thrombocytopenia, unspecified: Secondary | ICD-10-CM | POA: Insufficient documentation

## 2019-10-27 DIAGNOSIS — D691 Qualitative platelet defects: Secondary | ICD-10-CM

## 2019-10-27 LAB — SARS-COV-2, NAA 2 DAY TAT

## 2019-10-27 LAB — NOVEL CORONAVIRUS, NAA: SARS-CoV-2, NAA: NOT DETECTED

## 2019-10-28 ENCOUNTER — Encounter (HOSPITAL_COMMUNITY): Payer: Self-pay | Admitting: Family Medicine

## 2019-10-28 ENCOUNTER — Inpatient Hospital Stay (HOSPITAL_COMMUNITY): Payer: Managed Care, Other (non HMO)

## 2019-10-28 ENCOUNTER — Inpatient Hospital Stay (HOSPITAL_COMMUNITY)
Admission: AD | Admit: 2019-10-28 | Discharge: 2019-10-30 | DRG: 806 | Disposition: A | Discharge status: Home / Self Care | Payer: Managed Care, Other (non HMO) | Attending: Obstetrics and Gynecology | Admitting: Obstetrics and Gynecology

## 2019-10-28 ENCOUNTER — Other Ambulatory Visit: Payer: Self-pay | Admitting: Advanced Practice Midwife

## 2019-10-28 ENCOUNTER — Other Ambulatory Visit: Payer: Self-pay

## 2019-10-28 DIAGNOSIS — Z8759 Personal history of other complications of pregnancy, childbirth and the puerperium: Secondary | ICD-10-CM

## 2019-10-28 DIAGNOSIS — D696 Thrombocytopenia, unspecified: Secondary | ICD-10-CM | POA: Diagnosis present

## 2019-10-28 DIAGNOSIS — O9952 Diseases of the respiratory system complicating childbirth: Secondary | ICD-10-CM | POA: Diagnosis present

## 2019-10-28 DIAGNOSIS — O99519 Diseases of the respiratory system complicating pregnancy, unspecified trimester: Secondary | ICD-10-CM | POA: Diagnosis present

## 2019-10-28 DIAGNOSIS — O24425 Gestational diabetes mellitus in childbirth, controlled by oral hypoglycemic drugs: Secondary | ICD-10-CM | POA: Diagnosis present

## 2019-10-28 DIAGNOSIS — O4103X0 Oligohydramnios, third trimester, not applicable or unspecified: Principal | ICD-10-CM | POA: Diagnosis present

## 2019-10-28 DIAGNOSIS — O9912 Other diseases of the blood and blood-forming organs and certain disorders involving the immune mechanism complicating childbirth: Secondary | ICD-10-CM | POA: Diagnosis present

## 2019-10-28 DIAGNOSIS — Z3A37 37 weeks gestation of pregnancy: Secondary | ICD-10-CM

## 2019-10-28 DIAGNOSIS — O26893 Other specified pregnancy related conditions, third trimester: Secondary | ICD-10-CM | POA: Diagnosis present

## 2019-10-28 DIAGNOSIS — Z6791 Unspecified blood type, Rh negative: Secondary | ICD-10-CM

## 2019-10-28 DIAGNOSIS — O4100X0 Oligohydramnios, unspecified trimester, not applicable or unspecified: Secondary | ICD-10-CM | POA: Diagnosis present

## 2019-10-28 DIAGNOSIS — D691 Qualitative platelet defects: Secondary | ICD-10-CM | POA: Diagnosis present

## 2019-10-28 DIAGNOSIS — O24419 Gestational diabetes mellitus in pregnancy, unspecified control: Secondary | ICD-10-CM | POA: Diagnosis present

## 2019-10-28 DIAGNOSIS — J45909 Unspecified asthma, uncomplicated: Secondary | ICD-10-CM | POA: Diagnosis present

## 2019-10-28 LAB — CBC
HCT: 36.8 % (ref 36.0–46.0)
Hemoglobin: 12.7 g/dL (ref 12.0–15.0)
MCH: 32.1 pg (ref 26.0–34.0)
MCHC: 34.5 g/dL (ref 30.0–36.0)
MCV: 92.9 fL (ref 80.0–100.0)
Platelets: 124 10*3/uL — ABNORMAL LOW (ref 150–400)
RBC: 3.96 MIL/uL (ref 3.87–5.11)
RDW: 12.8 % (ref 11.5–15.5)
WBC: 12.3 10*3/uL — ABNORMAL HIGH (ref 4.0–10.5)
nRBC: 0 % (ref 0.0–0.2)

## 2019-10-28 LAB — TYPE AND SCREEN
ABO/RH(D): B NEG
Antibody Screen: POSITIVE

## 2019-10-28 LAB — GLUCOSE, CAPILLARY: Glucose-Capillary: 85 mg/dL (ref 70–99)

## 2019-10-28 MED ORDER — OXYTOCIN BOLUS FROM INFUSION
500.0000 mL | Freq: Once | INTRAVENOUS | Status: AC
  Administered 2019-10-29: 500 mL via INTRAVENOUS

## 2019-10-28 MED ORDER — LACTATED RINGERS IV SOLN: INTRAVENOUS | Status: DC

## 2019-10-28 MED ORDER — FLEET ENEMA 7-19 GM/118ML RE ENEM: 1.0000 | ENEMA | RECTAL | Status: DC | PRN

## 2019-10-28 MED ORDER — OXYTOCIN 40 UNITS IN NORMAL SALINE INFUSION - SIMPLE MED
2.5000 [IU]/h | INTRAVENOUS | Status: DC
  Administered 2019-10-29: 2.5 [IU]/h via INTRAVENOUS
  Filled 2019-10-28: qty 1000

## 2019-10-28 MED ORDER — OXYCODONE-ACETAMINOPHEN 5-325 MG PO TABS: 1.0000 | ORAL_TABLET | ORAL | Status: DC | PRN

## 2019-10-28 MED ORDER — SOD CITRATE-CITRIC ACID 500-334 MG/5ML PO SOLN: 30.0000 mL | ORAL | Status: DC | PRN

## 2019-10-28 MED ORDER — LACTATED RINGERS IV SOLN
500.0000 mL | INTRAVENOUS | Status: DC | PRN
  Administered 2019-10-29: 500 mL via INTRAVENOUS

## 2019-10-28 MED ORDER — LIDOCAINE HCL (PF) 1 % IJ SOLN
30.0000 mL | INTRAMUSCULAR | Status: DC | PRN
  Filled 2019-10-28: qty 30

## 2019-10-28 MED ORDER — OXYCODONE-ACETAMINOPHEN 5-325 MG PO TABS: 2.0000 | ORAL_TABLET | ORAL | Status: DC | PRN

## 2019-10-28 MED ORDER — OXYTOCIN 40 UNITS IN NORMAL SALINE INFUSION - SIMPLE MED
1.0000 m[IU]/min | INTRAVENOUS | Status: DC
  Administered 2019-10-28: 2 m[IU]/min via INTRAVENOUS
  Administered 2019-10-29: 5 m[IU]/min via INTRAVENOUS
  Administered 2019-10-29: 4 m[IU]/min via INTRAVENOUS
  Administered 2019-10-29: 6 m[IU]/min via INTRAVENOUS
  Administered 2019-10-29: 3 m[IU]/min via INTRAVENOUS

## 2019-10-28 MED ORDER — TERBUTALINE SULFATE 1 MG/ML IJ SOLN: 0.2500 mg | Freq: Once | INTRAMUSCULAR | Status: DC | PRN

## 2019-10-28 MED ORDER — MISOPROSTOL 50MCG HALF TABLET
50.0000 ug | ORAL_TABLET | ORAL | Status: DC | PRN
  Administered 2019-10-28: 50 ug via BUCCAL
  Filled 2019-10-28: qty 1

## 2019-10-28 MED ORDER — ACETAMINOPHEN 325 MG PO TABS: 650.0000 mg | ORAL_TABLET | ORAL | Status: DC | PRN

## 2019-10-28 MED ORDER — MISOPROSTOL 25 MCG QUARTER TABLET: 25.0000 ug | ORAL_TABLET | ORAL | Status: DC | PRN

## 2019-10-28 MED ORDER — ONDANSETRON HCL 4 MG/2ML IJ SOLN: 4.0000 mg | Freq: Four times a day (QID) | INTRAMUSCULAR | Status: DC | PRN

## 2019-10-28 NOTE — H&P (Signed)
OBSTETRIC ADMISSION HISTORY AND PHYSICAL  Oneka Parada is a 34 y.o. female [phone] with IUP at 58w0dby u/s presenting for IOL for oligohydramnios. She reports +FMs, No LOF, no VB, no blurry vision, headaches or peripheral edema, and RUQ pain.  She plans on breast feeding. She is unsure for birth control. [facility]  Dating: By u/s --->  Estimated Date of Delivery: 11/18/19   Nursing Staff Provider  Office Location  KV Dating  U/S  Language  English Anatomy [place] NML @ [facility]  Flu Vaccine   Genetic Screen  NIPS:  Declined  AFP:   First Screen: Quad: declined   TDaP vaccine   08/28/19 Hgb A1C or  GTT Early  Third trimester:  GDM  Rhogam  08/28/19   LAB RESULTS   Feeding Plan Breast  Blood Type   B Neg  Contraception Unsure Antibody POSITIVE (03/15 0828)Neg  Circumcision Unsure- If boy yes Rubella  Immune  Pediatrician  [facility].  RPR NON-REACTIVE (03/15 0828) NR  Support Person  stephen HBsAg   Neg  Prenatal Classes  HIV NON-REACTIVE (03/15 0828)NR  BTL Consent  GBS  (For PCN allergy, check sensitivities)   VBAC Consent NA Pap     Hgb Electro  declined  BP Cuff Yes  CF declined    SMA declined    Waterbirth  '[ ]'$  Class '[ ]'$  Consent '[ ]'$  CNM visit     Prenatal History/Complications: oligohydramnios, hx gHTN in previous pregnancy, Anti D antibodies, A2GDM, thrombocytopenia, previous preterm delivery  Past Medical History: Past Medical History:  Diagnosis Date  . Anxiety   . Asthma   . Depression   . Diabetes mellitus without complication ([place]   . Ectopic pregnancy   . GERD (gastroesophageal reflux disease)   . Gestational diabetes   . History of kidney stones   . History of miscarriage   . PCOS (polycystic ovarian syndrome)   . Placenta previa in second trimester 06/19/2019   Resolved on 07/2019 ultrasound   Posterior placenta previa (noted at anatomy [place]1/09/2019)  Patient counseled and bleeding precautions given   Plan:  Ultrasound at 32 weeks    Last Assessment & Plan:  Posterior placenta previa (noted at anatomy [place]/09/2019)  Patient previously counseled re: [place]; pelvic rest/bleeding precautions given  Plan for repeat ultrasound at 32 weeks  Formatting of this     Past Surgical History: Past Surgical History:  Procedure Laterality Date  . DILATION AND CURETTAGE OF UTERUS    . DILATION AND CURETTAGE OF UTERUS    . ECTOPIC PREGNANCY SURGERY      Obstetrical History: OB History    Gravida  9   Para  3   Term  3   Preterm  0   AB  5   Living  4     SAB  4   TAB      Ectopic  1   Multiple      Live Births  3           Social History Social History   Socioeconomic History  . Marital status: Married    Spouse name: Not on file  . Number of children: Not on file  . Years of education: Not on file  . Highest education level: Not on file  Occupational History  . Not on file  Tobacco Use  . Smoking status: Never Smoker  . Smokeless tobacco: Never Used  Substance and Sexual Activity  . Alcohol use: Never  . Drug use: Never  . Sexual activity: Yes    Birth control/protection: None  Other Topics Concern  . Not on file  Social History Narrative  . Not on file   Social Determinants of Health   Financial Resource Strain:   . Difficulty of Paying Living Expenses:   Food Insecurity:   . Worried About Charity fundraiser in the Last Year:   . Arboriculturist in the Last Year:   Transportation Needs:   . Film/video editor (Medical):   Marland Kitchen Lack of Transportation (Non-Medical):   Physical Activity:   . Days of Exercise per Week:   . Minutes of Exercise per Session:   Stress:   . Feeling of Stress :   Social Connections:   . Frequency of Communication with Friends and Family:   . Frequency of Social Gatherings with Friends and Family:   . Attends Religious Services:   . Active Member of Clubs or Organizations:   . Attends Archivist Meetings:   Marland Kitchen Marital Status:     Family  History: Family History  Problem Relation Age of Onset  . Kidney cancer Father   . Stroke Maternal Grandmother   . Diabetes type I Maternal Grandfather     Allergies: No Known Allergies  Medications Prior to Admission  Medication Sig Dispense Refill Last Dose  . albuterol (PROVENTIL) (2.5 MG/3ML) 0.083% nebulizer solution Inhale into the lungs.     Marland Kitchen albuterol (VENTOLIN HFA) 108 (90 Base) MCG/ACT inhaler Inhale 2 puffs into the lungs every 6 (six) hours as needed for wheezing or shortness of breath. 10 g 3   . aspirin 81 MG EC tablet Take by mouth.     . B Complex Vitamins (VITAMIN B COMPLEX) TABS Take by mouth.     . blood glucose meter kit and supplies KIT Dispense based on patient and insurance preference. Use up to four times daily as directed. (FOR ICD-9 250.00, 250.01). One Touch Meter and supplies for CBG testing QID 1 each 0   . budesonide-formoterol (SYMBICORT) 80-4.5 MCG/ACT inhaler Inhale into the lungs.     . magnesium 30 MG tablet Take 30 mg by mouth 2 (two) times daily.     . metFORMIN (GLUCOPHAGE) 500 MG tablet Take 3 tablets (1,500 mg total) by mouth 2 (two) times daily with a meal. 1000 mg Q breakfast, 500 mg @ bedtime. 60 tablet 5   . omeprazole (PRILOSEC) 20 MG capsule Take 1 capsule (20 mg total) by mouth daily. 30 capsule 4   . Prenatal Vit-Fe Fumarate-FA (MULTIVITAMIN-PRENATAL) 27-0.8 MG TABS tablet Take 1 tablet by mouth daily at 12 noon.     Marland Kitchen Spacer/Aero-Holding Chambers [facility] DIAMOND) MISC         Review of Systems   All systems reviewed and negative except as stated in HPI  Blood pressure 119/71, pulse 84, temperature 97.7 F (36.5 C), temperature source Oral, resp. rate 16, height _0  (1.575 m), weight 78.9 kg, last menstrual period 02/11/2019. General appearance: alert, cooperative and no distress Lungs: clear to auscultation bilaterally Heart: regular rate and rhythm Abdomen: soft, non-tender; bowel sounds normal Pelvic: n/a Extremities:  Homans sign is negative, no sign of DVT DTR's +2 Presentation: cephalic Fetal monitoringBaseline: 135 bpm, Variability: Good {> 6 bpm), Accelerations: Reactive and Decelerations: Absent Uterine activityNone Dilation: 1 Effacement (%): 50 Station: -2 Exam by:: Varney Baas, RN   Prenatal labs:  ABO, Rh: --/--/PENDING (05/15 1852) Antibody: PENDING (05/15 1852) Rubella:   RPR: NON-REACTIVE (03/15 0828)  HBsAg:    HIV: NON-REACTIVE (03/15 0828)  GBS:   Negative Prenatal Transfer Tool  Maternal Diabetes: Yes:  Diabetes Type:  Insulin/Medication controlled Genetic Screening: Normal Maternal Ultrasounds/Referrals: Normal Fetal Ultrasounds or other Referrals:  Referred to Materal Fetal Medicine  Maternal Substance Abuse:  No Significant Maternal Medications:  None Significant Maternal Lab Results: Group B Strep negative  Results for orders placed or performed during the hospital encounter of 10/28/19 (from the past 24 hour(s))  CBC   Collection Time: 10/28/19  6:52 PM  Result Value Ref Range   WBC 12.3 (H) 4.0 - 10.5 K/uL   RBC 3.96 3.87 - 5.11 MIL/uL   Hemoglobin 12.7 12.0 - 15.0 g/dL   HCT 36.8 36.0 - 46.0 %   MCV 92.9 80.0 - 100.0 fL   MCH 32.1 26.0 - 34.0 pg   MCHC 34.5 30.0 - 36.0 g/dL   RDW 12.8 11.5 - 15.5 %   Platelets 124 (L) 150 - 400 K/uL   nRBC 0.0 0.0 - 0.2 %  Type and screen   Collection Time: 10/28/19  6:52 PM  Result Value Ref Range   ABO/RH(D) PENDING    Antibody Screen PENDING    Sample Expiration      10/31/2019,2359 Performed at [facility], [place] [address] [place], [place] [address]     Patient Active Problem List   Diagnosis Date Noted  . Oligohydramnios 10/28/2019  . Abnormal glucose tolerance test (GTT) during pregnancy, antepartum 09/08/2019  . Gestational diabetes 08/29/2019  . 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 Maternal asthma complicating pregnancy 82/51/8984  . Rh negative state in antepartum period 08/21/2019  . Shortness of  breath 06/19/2019  . Generalized pain 05/29/2019  . Tachycardia 05/29/2019  . History of pregnancy induced hypertension 04/05/2019  . Pregnancy with history of ectopic pregnancy, antepartum 04/05/2019  . 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 PCOS (polycystic ovarian syndrome) 08/06/2016    Assessment/Plan:  Eleftheria Taborn is a 34 y.o. K1I3128 at 31w0dhere for IOL for oligohydramnios  #Labor: lengthy discussion with patient regarding IOL options. Patient states she has always been induced with pitocin. Discussed methods including cytotec, foley bulb and pitocin. Patient desires cytotec and declines FB at this time.  #Pain: Per patient request, discussed implications of thrombocytopenia with epidurals, platelets  124 on admission. #FWB: Cat 1 #ID:  GBS neg #MOF: Breast #MOC: unsure  CWende Mott CNM  10/28/2019, 7:36 PM

## 2019-10-28 NOTE — Progress Notes (Addendum)
Janice Hurley, [id number]  Subjective -Care assumed of 34 y.o. O1B5102 at [redacted]w[redacted]d who presents for IOL for Oligo. Pregnancy and medical history significant for Oligo, GHTN in previous pregnancy, Anti D Antibodies, Thrombocytopenia, and previous PTD.  In room to meet acquaintance of patient and husband.  Patient reports some "crampiness" and is ambulating in room.    Objective Vitals:   10/28/19 1831 10/28/19 1942  BP: 119/71   Pulse: 84   Resp: 16 16  Temp: 97.7 F (36.5 C)    No intake/output data recorded. No intake/output data recorded.  Physical Exam Constitutional:      Appearance: Normal appearance.  HENT:     Head: Normocephalic and atraumatic.  Eyes:     Conjunctiva/sclera: Conjunctivae normal.  Pulmonary:     Effort: Pulmonary effort is normal. No respiratory distress.  Abdominal:     Comments: Gravid--fundal height appears AGA, Soft, NT   Musculoskeletal:        General: Normal range of motion.     Cervical back: Normal range of motion.  Neurological:     Mental Status: She is alert and oriented to person, place, and time.  Psychiatric:        Mood and Affect: Mood normal.        Behavior: Behavior normal.     HEN:IDPOEUMP: 1 Effacement (%): 50 Cervical Position: Middle Station: -2 Presentation: Vertex Exam by:: Verdie Drown, RN  Membranes:Intact  FHR: 135 bpm, Mod Var, -Decels, -Accels UC:  Irritability Noted  Management Plan: Expectant:N/A Augmentation:N/A Induction Phase:S/P 1st Dose Cytotec at 1945  Assessment IUP at 37.0wks IOL for Oligo Cat I FT GBS Negative GDM2  Plan -Patient reports that she is doing well. -Questions and concerns addressed. -Chart reviewed and CBGs ordered. -Nurse informed of need to collect CBGs. -Continue other mgmt as ordered. -Per nurse, patient requests to eat. Okay for regular diet.  Cherre Robins, CNM 10/28/2019, 9:57 PM

## 2019-10-28 NOTE — Progress Notes (Signed)
Linde Wilensky MRN: [id number]  Subjective: -Patient resting in bed.  Reports perception of contractions, but remains comfortable and is coping well.    Objective: BP 125/68   Pulse 86   Temp 97.6 F (36.4 C) (Oral)   Resp 16   Ht 5\' 2"  (1.575 m)   Wt 78.9 kg   LMP 02/11/2019   BMI 31.83 kg/m  No intake/output data recorded. No intake/output data recorded.  Fetal Monitoring: FHT: 150 bpm, Mod Var, + Variable Decels, +Accels UC: Q70min    Vaginal Exam: SVE:   Dilation: 2 Effacement (%): 70 Station: -3 Exam by:: [email or web address], CNM  Membranes:Intact Internal Monitors: None  Augmentation/Induction: Pitocin:Initiated Cytotec: S/P One Dose  Assessment:  IUP at 37 weeks Cat II FT  IOL  Bishop Score 7  Plan: -Strip reviewed and occasional variable decel noted, but overall reassuring. No interventions necessary at current.  Will continue to monitor.  -Discussed progression in induction process and methods that can be utilized. -Patient reports history of pitocin usage with good results. -Provider agreeable and pitocin to be started at 2x1 -Patient expresses desire for no medication for pain management and support given. -Informed that pitocin will be titrated slowly to allow patient to cope. -Patient and husband without questions or concerns.  -Orders placed. -Continue other mgmt as ordered  Gerrit Heck, CNM Advanced Practice Provider, Center for [facility] 10/28/2019, 11:50 PM

## 2019-10-29 ENCOUNTER — Encounter (HOSPITAL_COMMUNITY): Payer: Self-pay | Admitting: Family Medicine

## 2019-10-29 DIAGNOSIS — Z3A37 37 weeks gestation of pregnancy: Secondary | ICD-10-CM

## 2019-10-29 LAB — GLUCOSE, CAPILLARY
Glucose-Capillary: 97 mg/dL (ref 70–99)
Glucose-Capillary: 92 mg/dL (ref 70–99)
Glucose-Capillary: 78 mg/dL (ref 70–99)
Glucose-Capillary: 77 mg/dL (ref 70–99)

## 2019-10-29 LAB — RPR: RPR Ser Ql: NONREACTIVE

## 2019-10-29 MED ORDER — COCONUT OIL OIL: 1.0000 "application " | TOPICAL_OIL | Status: DC | PRN

## 2019-10-29 MED ORDER — OXYTOCIN 40 UNITS IN NORMAL SALINE INFUSION - SIMPLE MED
1.0000 m[IU]/min | INTRAVENOUS | Status: DC
  Administered 2019-10-29: 12 m[IU]/min via INTRAVENOUS

## 2019-10-29 MED ORDER — SIMETHICONE 80 MG PO CHEW: 80.0000 mg | CHEWABLE_TABLET | ORAL | Status: DC | PRN

## 2019-10-29 MED ORDER — TETANUS-DIPHTH-ACELL PERTUSSIS 5-2.5-18.5 LF-MCG/0.5 IM SUSP: 0.5000 mL | Freq: Once | INTRAMUSCULAR | Status: DC

## 2019-10-29 MED ORDER — ACETAMINOPHEN 325 MG PO TABS
650.0000 mg | ORAL_TABLET | Freq: Four times a day (QID) | ORAL | Status: DC | PRN
  Administered 2019-10-29 – 2019-10-30 (×2): 650 mg via ORAL
  Filled 2019-10-29 (×2): qty 2

## 2019-10-29 MED ORDER — PNEUMOCOCCAL VAC POLYVALENT 25 MCG/0.5ML IJ INJ: 0.5000 mL | INJECTION | INTRAMUSCULAR | Status: DC

## 2019-10-29 MED ORDER — BENZOCAINE-MENTHOL 20-0.5 % EX AERO
1.0000 "application " | INHALATION_SPRAY | CUTANEOUS | Status: DC | PRN
  Administered 2019-10-29: 1 via TOPICAL
  Filled 2019-10-29: qty 56

## 2019-10-29 MED ORDER — ALBUTEROL SULFATE (2.5 MG/3ML) 0.083% IN NEBU: 3.0000 mL | INHALATION_SOLUTION | Freq: Four times a day (QID) | RESPIRATORY_TRACT | Status: DC | PRN

## 2019-10-29 MED ORDER — DIPHENHYDRAMINE HCL 25 MG PO CAPS: 25.0000 mg | ORAL_CAPSULE | Freq: Four times a day (QID) | ORAL | Status: DC | PRN

## 2019-10-29 MED ORDER — ONDANSETRON HCL 4 MG/2ML IJ SOLN: 4.0000 mg | INTRAMUSCULAR | Status: DC | PRN

## 2019-10-29 MED ORDER — WITCH HAZEL-GLYCERIN EX PADS: 1.0000 "application " | MEDICATED_PAD | CUTANEOUS | Status: DC | PRN

## 2019-10-29 MED ORDER — PRENATAL MULTIVITAMIN CH: 1.0000 | ORAL_TABLET | Freq: Every day | ORAL | Status: DC

## 2019-10-29 MED ORDER — MEASLES, MUMPS & RUBELLA VAC IJ SOLR: 0.5000 mL | Freq: Once | INTRAMUSCULAR | Status: DC

## 2019-10-29 MED ORDER — DIBUCAINE (PERIANAL) 1 % EX OINT: 1.0000 "application " | TOPICAL_OINTMENT | CUTANEOUS | Status: DC | PRN

## 2019-10-29 MED ORDER — IBUPROFEN 600 MG PO TABS
600.0000 mg | ORAL_TABLET | Freq: Three times a day (TID) | ORAL | Status: DC | PRN
  Administered 2019-10-29 – 2019-10-30 (×4): 600 mg via ORAL
  Filled 2019-10-29 (×4): qty 1

## 2019-10-29 MED ORDER — SENNOSIDES-DOCUSATE SODIUM 8.6-50 MG PO TABS
2.0000 | ORAL_TABLET | ORAL | Status: DC
  Administered 2019-10-29: 2 via ORAL
  Filled 2019-10-29: qty 2

## 2019-10-29 MED ORDER — ONDANSETRON HCL 4 MG PO TABS: 4.0000 mg | ORAL_TABLET | ORAL | Status: DC | PRN

## 2019-10-29 NOTE — Progress Notes (Signed)
Labor Progress Note Janice Hurley is a 34 y.o. [id number] at [redacted]w[redacted]d presented for IOL for oligohydramnios   S: Very uncomfortable. Feeling pressure.   O:  BP 107/62   Pulse 77   Temp 97.9 F (36.6 C) (Oral)   Resp 16   Ht 5\' 2"  (1.575 m)   Wt 78.9 kg   LMP 02/11/2019   BMI 31.83 kg/m  EFM: 145/moderate var/pos accels, no decels TOCO: q1-28m  CVE: Dilation: 7 Effacement (%): 80 Cervical Position: Middle Station: 0 Presentation: Vertex Exam by:: Dr. [email or web address]   A&P: 34 y.o. 20 [redacted]w[redacted]d  IOL for oligohydramnios.  #Labor: S/P foley bulb, Cyto and AROM. Cont Pit. Anticipate SVD soon. #Pain: Trying to avoid medication.  PRN meds or Epidural upon request. #FWB: category I #GBS negative  #thrombocytopenia: monitor bleeding, platelets 124 #GDMA2: Last glucose 78  [redacted]w[redacted]d, MD 2:44 PM

## 2019-10-29 NOTE — Progress Notes (Signed)
Janice Hurley [id number]  Subjective: Strip and Chart Reviewed.  Objective:  Vitals:   10/28/19 2347 10/28/19 2357 10/29/19 0033 10/29/19 0123  BP: 125/68 125/75 122/69 109/61  Pulse: 86 89 81 82  Resp: 16 16 17 16   Temp: 97.6 F (36.4 C)     TempSrc: Oral     Weight:      Height:        FHR: 150 bpm, Mod Var, +Variable vs Late Decels, +Accels UC: Q1-43min  Assessment: IUP at [redacted]w[redacted]d Cat II FT IOL  Plan: -Nurse instructed to give fluid bolus for improvement. -However, fetal tracing overall reassuring with good variability. -Dr. [redacted]w[redacted]d notified.   Macon Large, CNM 10/29/2019 1:45 AM

## 2019-10-29 NOTE — Lactation Note (Signed)
This note was copied from a baby's chart. Lactation Consultation Note  Patient Name: Janice Hurley OXBDZ'H Date: 10/29/2019 Reason for consult: Initial assessment;Early term 37-38.6wks;Maternal endocrine disorder Type of Endocrine Disorder?: PCOS(GDM)  P5 exp BF mom.  BF at least 1 year with all children.  Mom has had one other "early" infant at 36.6 wks.  LC discussed BF basics with mom.  Offered to help with hand expression; mom declined.  She was shown illustration in book.  Bullet provided and spoon to collect EBM.  LC explained hand expression and feeding back EBM to infant early on will help with milk stimulation and give infant calories to BF better.  Infant has bf 2 already per mom.    Feeding cues discussed as well as STS, +8-12 feeds in 24h, waking techniques, and importance of notifying RN is infant isn't waking to feed or sleeping frequently during feedings.    Mom is aware of lactation services and BFSG, phone line, and OP services.  Maternal Data Formula Feeding for Exclusion: No Has patient been taught Hand Expression?: No(declined offer to assist with hand expression) Does the patient have breastfeeding experience prior to this delivery?: Yes  Feeding Feeding Type: Breast Fed  LATCH Score Latch: Grasps breast easily, tongue down, lips flanged, rhythmical sucking.  Audible Swallowing: A few with stimulation  Type of Nipple: Everted at rest and after stimulation  Comfort (Breast/Nipple): Soft / non-tender  Hold (Positioning): No assistance needed to correctly position infant at breast.  LATCH Score: 9  Interventions Interventions: Breast feeding basics reviewed;Skin to skin  Lactation Tools Discussed/Used     Consult Status Consult Status: Follow-up Date: 10/30/19 Follow-up type: In-patient    Maryruth Hancock [facility] 10/29/2019, 7:02 PM

## 2019-10-29 NOTE — Discharge Summary (Signed)
Postpartum Discharge Summary Patient Name: Janice Hurley DOB: 1986/02/13 MRN: [id number]  Date of admission: 10/28/2019 Delivery date:10/29/2019  Delivering provider: Matilde Haymaker  Date of discharge: 10/30/2019  Admitting diagnosis: Oligohydramnios [O41.00X0] Intrauterine pregnancy: [redacted]w[redacted]d    Secondary diagnosis:  Active Problems:   History of pregnancy induced hypertension   Maternal asthma complicating pregnancy   Rh negative state in antepartum period   Gestational diabetes   [place]rombocytopathia ([place]   Oligohydramnios  Additional problems: None    Discharge diagnosis: Term Pregnancy Delivered and GDM A2                                              Post partum procedures:None Augmentation: AROM, Pitocin, Cytotec and IP Foley Complications: None  Hospital course: Induction of Labor With Vaginal Delivery   34y.o. yo [id number] 381w1das admitted to the hospital 10/28/2019 for induction of labor.  Indication for induction: oligohydramnios.  Patient had an uncomplicated labor course as follows: Initial SVE 1/50/-2. Patient received Foley bulb, Cytotec, Pitocin and AROM. She then progressed to complete with uncomplicated delivery.  Membrane Rupture Time/Date: 12:28 PM ,10/29/2019   Delivery Method:Vaginal, Spontaneous  Episiotomy: None  Lacerations:  Perineal  Details of delivery can be found in separate delivery note. Patient had a routine postpartum course. Patient is discharged home 10/30/19.  Newborn Data: Birth date:10/29/2019  Birth time:4:11 PM  Gender:Female  Living status:Living  Apgars:9 ,9   Magnesium Sulfate received: No BMZ received: No Rhophylac: MMR:No T-DaP:Given prenatally Flu: No Transfusion:No  Physical exam  Vitals:   10/29/19 1800 10/29/19 1900 10/29/19 2330 10/30/19 0328  BP: (!) 104/56 105/64 113/69 104/70  Pulse: 74 73 79 62  Resp: '18 20 18 16  ' Temp: 98.1 F (36.7 C) 98.1 F (36.7 C) 98.2 F (36.8 C) 98.6 F (37 C)  TempSrc: Oral Oral Oral  Oral  SpO2: 100% 98% 99% 99%  Weight:      Height:       General: alert, cooperative and no distress Lochia: appropriate Uterine Fundus: firm Incision: N/A DVT Evaluation: No evidence of DVT seen on physical exam. Labs: Lab Results  Component Value Date   WBC 12.3 (H) 10/28/2019   HGB 12.7 10/28/2019   HCT 36.8 10/28/2019   MCV 92.9 10/28/2019   PLT 124 (L) 10/28/2019   CMP Latest Ref Rng & Units 10/23/2019  Glucose 65 - 99 mg/dL 83  BUN 7 - 25 mg/dL 13  Creatinine 0.50 - 1.10 mg/dL 0.57  Sodium 135 - 146 mmol/L 140  Potassium 3.5 - 5.3 mmol/L 3.9  Chloride 98 - 110 mmol/L 106  CO2 20 - 32 mmol/L 21  Calcium 8.6 - 10.2 mg/dL 8.9  Total Protein 6.1 - 8.1 g/dL 5.9(L)  Total Bilirubin 0.2 - 1.2 mg/dL 0.4  AST 10 - 30 U/L 17  ALT 6 - 29 U/L 17   Edinburgh Score: Edinburgh Postnatal Depression Scale Screening Tool 10/29/2019  I have been able to laugh and see the funny side of things. 0  I have looked forward with enjoyment to things. 0  I have blamed myself unnecessarily when things went wrong. 1  I have been anxious or worried for no good reason. 0  I have felt scared or panicky for no good reason. 0  Things have been getting on top of me. 2  I  have been so unhappy that I have had difficulty sleeping. 0  I have felt sad or miserable. 1  I have been so unhappy that I have been crying. 1  The thought of harming myself has occurred to me. 0  Edinburgh Postnatal Depression Scale Total 5     After visit meds:  Allergies as of 10/30/2019   No Known Allergies     Medication List    STOP taking these medications   aspirin 81 MG EC tablet   blood glucose meter kit and supplies Kit   metFORMIN 500 MG tablet Commonly known as: GLUCOPHAGE     TAKE these medications   albuterol (2.5 MG/3ML) 0.083% nebulizer solution Commonly known as: PROVENTIL Inhale into the lungs.   albuterol 108 (90 Base) MCG/ACT inhaler Commonly known as: VENTOLIN HFA Inhale 2 puffs into the  lungs every 6 (six) hours as needed for wheezing or shortness of breath.   ibuprofen 600 MG tablet Commonly known as: ADVIL Take 1 tablet (600 mg total) by mouth every 8 (eight) hours as needed for mild pain.   magnesium 30 MG tablet Take 30 mg by mouth 2 (two) times daily.   multivitamin-prenatal 27-0.8 MG Tabs tablet Take 1 tablet by mouth daily at 12 noon.   omeprazole 20 MG capsule Commonly known as: PriLOSEC Take 1 capsule (20 mg total) by mouth daily.   optichamber diamond Misc   Symbicort 80-4.5 MCG/ACT inhaler Generic drug: budesonide-formoterol Inhale into the lungs.   Vitamin B Complex Tabs Take by mouth.        Discharge home in stable condition Infant Feeding: Breast Infant Disposition:home with mother Discharge instruction: per After Visit Summary and Postpartum booklet. Activity: Advance as tolerated. Pelvic rest for 6 weeks.  Diet: routine diet Future Appointments: Future Appointments  Date Time Provider [place]  10/30/2019  4:15 PM Anyanwu, Sallyanne Havers, MD [facility]   Follow up Visit: [place] [facility] [place] [facility] Follow up.   Specialty: Obstetrics and Gynecology Contact information: [place], [place] [phone]           Please schedule this patient for a In person postpartum visit in 4 weeks with the following provider: Any provider. Additional Postpartum F/U:2 hour GTT  High risk pregnancy complicated by: GDM Delivery mode:  Vaginal, Spontaneous  Anticipated Birth Control:  Unsure   10/30/2019 Wende Mott, CNM

## 2019-10-29 NOTE — Progress Notes (Signed)
Janice Hurley MRN: [id number]  Subjective: -Patient resting in bed.  Reports that pitocin is not making contractions strong enough!  Rates pain 6/10, but this has been consistent.   Objective: BP 106/62   Pulse 83   Temp 97.6 F (36.4 C) (Oral)   Resp 16   Ht 5\' 2"  (1.575 m)   Wt 78.9 kg   LMP 02/11/2019   BMI 31.83 kg/m  No intake/output data recorded. No intake/output data recorded.  Fetal Monitoring: FHT: 135 bpm, Mod Var, + Variable Decels, +Accels UC: Q2-35min    Vaginal Exam: SVE:   Dilation: 2.5 Effacement (%): 70 Station: -3 Exam by:: [email or web address], CNM  Membranes:Intact Internal Monitors: None  Augmentation/Induction: Pitocin:59mUn/min Cytotec: S/P One Dose Foley Bulb inserted without issues  Assessment:  IUP at 37.1 weeks Cat II FT IOL for Oligo  Plan: -Discussed fetal monitoring findings and reassured that variable decelerations are expected in known setting of oligohydramnios.  -Patient verbalizes understanding and without questions.  -Discussed foley bulb insertion including r/b, placement, associated discomfort, continuation of pitocin, and expulsion vs removal. -Will leave pitocin at 52mUn/min. -Continue other mgmt as ordered.  4m, CNM Advanced Practice Provider, Center for [facility] 10/29/2019, 4:23 AM

## 2019-10-29 NOTE — Progress Notes (Signed)
Janice Hurley [id number]  Subjective: Nurse call reports foley bulb expelled.  Strip and Chart Reviewed.  Objective:  Vitals:   10/29/19 0325 10/29/19 0402 10/29/19 0516 10/29/19 0631  BP: (!) 102/56 106/62 (!) 115/57 (!) 107/52  Pulse: 76 83 81 73  Resp: 16 16 17 16   Temp:  97.6 F (36.4 C) 98 F (36.7 C)   TempSrc:  Oral Oral   Weight:      Height:        FHR: 135 bpm, Mod Var, -Decels, +Accels UC: Q1-5min  Assessment: IUP at [redacted]w[redacted]d Cat I FT IOL  Plan: -Start pitocin titration as appropriate. -Will plan for AROM at next check.  -Continue other mgmt as ordered.  [redacted]w[redacted]d, CNM 10/29/2019 6:37 AM

## 2019-10-29 NOTE — Progress Notes (Signed)
Labor Progress Note Janice Hurley is a 34 y.o. M0N4709 at [redacted]w[redacted]d presented for IOL for oligohydramnios   S: Comfortable. Not feeling many ctx.  O:  BP 111/64   Pulse 65   Temp 97.9 F (36.6 C) (Oral)   Resp 16   Ht 5\' 2"  (1.575 m)   Wt 78.9 kg   LMP 02/11/2019   BMI 31.83 kg/m  EFM: 135/moderate var/pos accels, no decels TOCO: q4-68m  CVE: Dilation: 4.5 Effacement (%): 60 Cervical Position: Middle Station: -2 Presentation: Vertex Exam by:: Dr. [email or web address]    A&P: 34 y.o. 20 [redacted]w[redacted]d  IOL for oligohydramnios.  #Labor: S/P foley bulb and Cytotec. Definitive AROM at this exam with scant clear fluid. Cont Pit. Anticipate SVD. #Pain: Trying to avoid medication.  PRN meds or Epidural upon request. #FWB: category I #GBS negative  #thrombocytopenia: monitor bleeding, platelets 124 #GDMA2: Last glucose 78  [redacted]w[redacted]d, MD 12:39 PM

## 2019-10-29 NOTE — Progress Notes (Addendum)
Labor Progress Note Janice Hurley is a 34 y.o. [id number] at [redacted]w[redacted]d presented for IOL for oligohydramnios   S: Continued moderate pain with contractions.  No evidence of water breaking.  No N/V.  O:  BP (!) 111/53   Pulse 76   Temp 98.1 F (36.7 C) (Oral)   Resp 16   Ht 5\' 2"  (1.575 m)   Wt 78.9 kg   LMP 02/11/2019   BMI 31.83 kg/m  EFM: 135/moderate var/pos accels, no decels  CVE: Dilation: 5 Effacement (%): 80 Cervical Position: Middle Station: 0 Presentation: Vertex Exam by:: Dr. [email or web address]   A&P: 34 y.o. 20 [redacted]w[redacted]d  IOL for oligohydramnios  #Labor: S/P foley bulb. S/P cytotec x1. Now on pitocin. AROM with minimal fluid. #Pain: Trying to avoid medication.  PRN meds or Epidural upon request. #FWB: category I #GBS negative  #thrombocytopenia: monitor bleeding #GDMA2: Q4 glucose checks  [redacted]w[redacted]d, MD 10:06 AM

## 2019-10-30 ENCOUNTER — Encounter (HOSPITAL_COMMUNITY): Payer: Self-pay | Admitting: Family Medicine

## 2019-10-30 ENCOUNTER — Encounter: Payer: Managed Care, Other (non HMO) | Admitting: Obstetrics & Gynecology

## 2019-10-30 LAB — GLUCOSE, CAPILLARY: Glucose-Capillary: 101 mg/dL — ABNORMAL HIGH (ref 70–99)

## 2019-10-30 MED ORDER — RHO D IMMUNE GLOBULIN 1500 UNIT/2ML IJ SOSY
300.0000 ug | PREFILLED_SYRINGE | Freq: Once | INTRAMUSCULAR | Status: AC
  Administered 2019-10-30: 300 ug via INTRAVENOUS
  Filled 2019-10-30: qty 2

## 2019-10-30 MED ORDER — IBUPROFEN 600 MG PO TABS: 600.0000 mg | ORAL_TABLET | Freq: Three times a day (TID) | ORAL | 0 refills | Status: DC | PRN

## 2019-10-30 NOTE — Progress Notes (Signed)
CSW received consult for hx of Anxiety and Depression.  CSW met with MOB to offer support and complete assessment.    CSW congratulated MOB on the birth of infant. CSW advised MOB of CSWs' role and the reason for CSW coming to visit with her. 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 MOB reported that she was well aware of her anxiety and depression as it started right after MOB reported a miscarriage of triplets. MOB went on to tell this CSW that she was pregnant with triplets in 2018 and lost two of those babies. MOB reported that this in turn increased her anxiety and depression as she would worry about other baby a lot. MOB reported that she never was on medications or in therapy. MOB does report that she would use her friends to speak with in order to get through her times of anxiety and depression. MOB reported that she hasn't dealt with depression or anxiety since and reported that during this pregnancy she felt fine. MOB reported that she isn't feeling SI or HI and reported no DV.   MOB reported to this CSW that she has all needed items to care for infant with no other needs. MOB reported that her supports include her spouse and her family. MOB reported no other concerns to CSW at this time.   CSW provided education regarding the baby blues period vs. perinatal mood disorders, discussed treatment and gave resources for mental health follow up if concerns arise.  CSW recommends self-evaluation during the postpartum time period using the New Mom Checklist from Postpartum Progress and encouraged MOB to contact a medical professional if symptoms are noted at any time.   CSW provided review of Sudden Infant Death Syndrome (SIDS) precautions.     CSW identifies no further need for intervention and no barriers to discharge at this time.    Janice Hurley, MSW, LCSW [facility] [phone]

## 2019-10-30 NOTE — Discharge Instructions (Signed)

## 2019-10-30 NOTE — Lactation Note (Signed)
This note was copied from a baby's chart. Lactation Consultation Note  Patient Name: Girl Litsy Epting SEGBT'D Date: 10/30/2019 Reason for consult: Follow-up assessment  P5 mother whose infant is now 29 hours old.  This is an ETI at 37+1 weeks.  Mother has breast fed all of her other children and has recently finished breast feeding her two year old.  She also has experience with a LPTI.  Mother has been discharged and she informed me that the pediatrician is discharging her after the 24 hours tests have been completed.  She had no questions/concerns related to breast feeding.  Encouraged to feed at least every three hours due to gestational age or sooner if baby desires.  Mother has not been supplementing or pumping and she is not interested in doing this.  She feels like baby is latching well and denies pain with feeding.  Coconut oil provided for comfort.  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  She seems very organized and efficient.  She has our OP phone number for questions/concerns after discharge.  Mother has a DEBP for home use but prefers not to use a pump at all, stating she has had better luck with a hand pump if needed.  Father present.  RN updated.  Mother excited to be discharged later today.   Maternal Data    Feeding    LATCH Score                   Interventions    Lactation Tools Discussed/Used     Consult Status Consult Status: Complete Date: 10/30/19 Follow-up type: Call as needed    Irene Pap Tashaun Obey 10/30/2019, 4:56 PM

## 2019-10-31 LAB — RH IG WORKUP (INCLUDES ABO/RH)
ABO/RH(D): B NEG
Fetal Screen: NEGATIVE
Gestational Age(Wks): 37.1
Unit division: 0

## 2019-11-03 ENCOUNTER — Ambulatory Visit: Payer: Managed Care, Other (non HMO)

## 2019-11-23 ENCOUNTER — Encounter: Payer: Self-pay | Admitting: Medical-Surgical

## 2019-11-23 ENCOUNTER — Ambulatory Visit (INDEPENDENT_AMBULATORY_CARE_PROVIDER_SITE_OTHER): Payer: Managed Care, Other (non HMO) | Admitting: Medical-Surgical

## 2019-11-23 VITALS — BP 129/84 | HR 72 | Temp 98.1°F | Ht 63.0 in | Wt 160.8 lb

## 2019-11-23 DIAGNOSIS — Z7689 Persons encountering health services in other specified circumstances: Secondary | ICD-10-CM | POA: Diagnosis not present

## 2019-11-23 DIAGNOSIS — Z1159 Encounter for screening for other viral diseases: Secondary | ICD-10-CM

## 2019-11-23 DIAGNOSIS — R1011 Right upper quadrant pain: Secondary | ICD-10-CM

## 2019-11-23 NOTE — Progress Notes (Signed)
New Patient Office Visit  Subjective:  Patient ID: Janice Hurley, female    DOB: 06-20-1985  Age: 34 y.o. MRN: [id number]  CC:  Chief Complaint  Patient presents with  . Establish Care  . Abdominal Pain    onset:couple of months, during pregnancy, has persisted since giving birth, was happening intermittently but started having the pain on a daily basis for the last week, husband had same sx last year and had to have his gall bladder removed, thinks this may be related to her gall bladder since the sx are the same    HPI Janice Hurley is a pleasant 34 year old female who presents to establish care.  She is currently receiving care from an OB/GYN as she is approximately 3 weeks postpartum with a new baby girl named Sophie.  She is doing well overall postpartum however she did develop significant complications with her pregnancy.  During this last pregnancy she developed gestational diabetes, asthma, severe right upper quadrant pain, as well as other concerning symptoms of joint pain, fatigue, frequent headaches, chest pain, night sweats, chills, and GI upset.  She is being seen by rheumatology as well as asthma and allergy for further work-up and evaluation.  Her main concern today is the upper quadrant abdominal pain that she has been having for the last couple of months.  This developed during her pregnancy and has persisted.  She reports the pain is in bilateral upper quadrants and is described as stabbing, severe, radiates to back lasting approximately 15 minutes then subsiding to an ache that lasts approximately an hour.  When she is experiencing this pain she also has nausea but no vomiting.  Has noted that she has had more diarrhea recently.  She has found no alleviating factors and has been unable to identify a specific aggravating factor.  Does not seem to be related to any 1 thing and has happened with food, on empty stomach, and intermittently without regard to what she eats.  No fevers but she  does report she is constantly cold.  Her husband had the same symptoms last year and ended up with a gallbladder removal.  She suspects she may also need to have her gallbladder taken out.  Past Medical History:  Diagnosis Date  . Anxiety   . Asthma   . Depression   . Diabetes mellitus without complication ([place])   . Ectopic pregnancy   . GERD (gastroesophageal reflux disease)   . Gestational diabetes   . History of kidney stones   . History of miscarriage   . PCOS (polycystic ovarian syndrome)   . Placenta previa in second trimester 06/19/2019   Resolved on 07/2019 ultrasound   Posterior placenta previa (noted at anatomy US 06/19/2019)  Patient counseled and bleeding precautions given   Plan:  Ultrasound at 32 weeks   Last Assessment & Plan:  Posterior placenta previa (noted at anatomy US 06/19/2019)  Patient previously counseled re: [place] finding; pelvic rest/bleeding precautions given  Plan for repeat ultrasound at 32 weeks  Formatting of this     Past Surgical History:  Procedure Laterality Date  . DILATION AND CURETTAGE OF UTERUS    . DILATION AND CURETTAGE OF UTERUS    . ECTOPIC PREGNANCY SURGERY      Family History  Problem Relation Age of Onset  . Kidney cancer Father   . Stroke Maternal Grandmother   . Diabetes type I Maternal Grandfather     Social History   Socioeconomic History  . Marital status:  Married    Spouse name: Not on file  . Number of children: Not on file  . Years of education: Not on file  . Highest education level: Not on file  Occupational History  . Not on file  Tobacco Use  . Smoking status: Never Smoker  . Smokeless tobacco: Never Used  Vaping Use  . Vaping Use: Never used  Substance and Sexual Activity  . Alcohol use: Never  . Drug use: Never  . Sexual activity: Yes    Partners: Male    Birth control/protection: Other-see comments    Comment: recent pregnancy  Other Topics Concern  . Not on file  Social History Narrative  . Not on file    Social Determinants of Health   Financial Resource Strain:   . Difficulty of Paying Living Expenses:   Food Insecurity:   . Worried About Programme researcher, broadcasting/film/video in the Last Year:   . Barista in the Last Year:   Transportation Needs:   . Freight forwarder (Medical):   Marland Kitchen Lack of Transportation (Non-Medical):   Physical Activity:   . Days of Exercise per Week:   . Minutes of Exercise per Session:   Stress:   . Feeling of Stress :   Social Connections:   . Frequency of Communication with Friends and Family:   . Frequency of Social Gatherings with Friends and Family:   . Attends Religious Services:   . Active Member of Clubs or Organizations:   . Attends Banker Meetings:   Marland Kitchen Marital Status:   Intimate Partner Violence:   . Fear of Current or Ex-Partner:   . Emotionally Abused:   Marland Kitchen Physically Abused:   . Sexually Abused:     ROS Review of Systems  Constitutional: Positive for chills and fatigue. Negative for fever and unexpected weight change.  Eyes: Positive for visual disturbance.  Respiratory: Positive for cough, chest tightness and shortness of breath. Negative for wheezing.   Cardiovascular: Positive for chest pain. Negative for palpitations and leg swelling.  Gastrointestinal: Positive for abdominal pain, diarrhea and nausea.  Musculoskeletal: Positive for arthralgias, back pain, myalgias and neck pain.  Neurological: Positive for light-headedness and headaches.  Psychiatric/Behavioral: Negative for dysphoric mood, self-injury, sleep disturbance and suicidal ideas. The patient is not nervous/anxious.     Objective:   Today's Vitals: BP 129/84   Pulse 72   Temp 98.1 F (36.7 C) (Oral)   Ht 5\' 3"  (1.6 m)   Wt 160 lb oz (72.9 kg)   LMP 02/11/2019   SpO2 97%   BMI 28.48 kg/m   Physical Exam Vitals reviewed.  Constitutional:      General: She is not in acute distress.    Appearance: Normal appearance. She is well-developed.  HENT:      Head: Normocephalic and atraumatic.  Cardiovascular:     Rate and Rhythm: Normal rate and regular rhythm.     Pulses: Normal pulses.     Heart sounds: Normal heart sounds. No murmur heard.  No friction rub. No gallop.   Pulmonary:     Effort: Pulmonary effort is normal. No respiratory distress.     Breath sounds: Normal breath sounds. No wheezing.  Abdominal:     General: Bowel sounds are normal. There is no distension or abdominal bruit. There are no signs of injury.     Palpations: Abdomen is soft. There is no mass.     Tenderness: There is generalized abdominal tenderness.  There is no guarding or rebound. Positive signs include Murphy's sign. Negative signs include McBurney's sign.     Hernia: No hernia is present.  Skin:    General: Skin is warm and dry.  Neurological:     Mental Status: She is alert and oriented to person, place, and time.     Comments: When eliciting Murphy's sign, patient's pupils were witnessed dilating drastically before constricting very quickly.  She did experience lightheadedness at this point.  After lying supine for approximately 15 seconds, lightheadedness resolved and she was able to sit up slowly.  Change in position led to return of the lightheadedness for approximately 30 seconds then it resolved.  Psychiatric:        Mood and Affect: Mood normal.        Behavior: Behavior normal.        Thought Content: Thought content normal.        Judgment: Judgment normal.     Assessment & Plan:   1. Encounter to establish care Reviewed medical records and history with patient.  She is under the care of several providers at this time so we will attempt to obtain records to maintain a complete chart.  She will be following up with OB/GYN as instructed next week and they will be doing further lab work to follow-up on her gestational diabetes.  2. RUQ pain Checking amylase and lipase.  Recent CMP and CBC normal.  We will also check a right upper quadrant  abdominal ultrasound. - US ABDOMEN LIMITED RUQ; Future - Amylase - Lipase  3. Need for hepatitis C screening test Discussed hepatitis C screening recommendations.  Patient agreeable to complete screening so we will add this to her blood work today. - Hepatitis C antibody  Outpatient Encounter Medications as of 11/23/2019  Medication Sig  . albuterol (PROVENTIL) (2.5 MG/3ML) 0.083% nebulizer solution Inhale into the lungs.  Marland Kitchen albuterol (VENTOLIN HFA) 108 (90 Base) MCG/ACT inhaler Inhale 2 puffs into the lungs every 6 (six) hours as needed for wheezing or shortness of breath.  . budesonide-formoterol (SYMBICORT) 80-4.5 MCG/ACT inhaler Inhale 2 puffs into the lungs daily.   Marland Kitchen omeprazole (PRILOSEC) 20 MG capsule Take 1 capsule (20 mg total) by mouth daily.  . Prenatal Vit-Fe Fumarate-FA (MULTIVITAMIN-PRENATAL) 27-0.8 MG TABS tablet Take 1 tablet by mouth daily at 12 noon.  Marland Kitchen Spacer/Aero-Holding Chambers [facility] DIAMOND) MISC   . [DISCONTINUED] B Complex Vitamins (VITAMIN B COMPLEX) TABS Take by mouth.  . [DISCONTINUED] ibuprofen (ADVIL) 600 MG tablet Take 1 tablet (600 mg total) by mouth every 8 (eight) hours as needed for mild pain.  . [DISCONTINUED] magnesium 30 MG tablet Take 30 mg by mouth 2 (two) times daily.   No facility-administered encounter medications on file as of 11/23/2019.    Follow-up: Return if symptoms worsen or fail to improve.  Further follow-up pending lab/imaging results.  Thayer Ohm, DNP, APRN, FNP-BC [facility] and [facility]

## 2019-11-24 ENCOUNTER — Ambulatory Visit (INDEPENDENT_AMBULATORY_CARE_PROVIDER_SITE_OTHER): Payer: Managed Care, Other (non HMO)

## 2019-11-24 ENCOUNTER — Other Ambulatory Visit: Payer: Self-pay | Admitting: Medical-Surgical

## 2019-11-24 ENCOUNTER — Other Ambulatory Visit: Payer: Self-pay

## 2019-11-24 DIAGNOSIS — R1011 Right upper quadrant pain: Secondary | ICD-10-CM

## 2019-11-24 DIAGNOSIS — R748 Abnormal levels of other serum enzymes: Secondary | ICD-10-CM

## 2019-11-24 DIAGNOSIS — K8020 Calculus of gallbladder without cholecystitis without obstruction: Secondary | ICD-10-CM

## 2019-11-24 LAB — HEPATITIS C ANTIBODY
Hepatitis C Ab: NONREACTIVE
SIGNAL TO CUT-OFF: 0 (ref <1.00)

## 2019-11-24 LAB — LIPASE: Lipase: 69 U/L — ABNORMAL HIGH (ref 7–60)

## 2019-11-24 LAB — AMYLASE: Amylase: 50 U/L (ref 21–101)

## 2019-12-01 ENCOUNTER — Other Ambulatory Visit: Payer: Self-pay

## 2019-12-01 ENCOUNTER — Encounter: Payer: Self-pay | Admitting: Obstetrics and Gynecology

## 2019-12-01 ENCOUNTER — Ambulatory Visit (INDEPENDENT_AMBULATORY_CARE_PROVIDER_SITE_OTHER): Payer: Managed Care, Other (non HMO) | Admitting: Obstetrics and Gynecology

## 2019-12-01 VITALS — BP 135/89 | HR 64 | Resp 16 | Ht 63.0 in | Wt 161.0 lb

## 2019-12-01 DIAGNOSIS — O24419 Gestational diabetes mellitus in pregnancy, unspecified control: Secondary | ICD-10-CM

## 2019-12-01 DIAGNOSIS — G43809 Other migraine, not intractable, without status migrainosus: Secondary | ICD-10-CM

## 2019-12-01 DIAGNOSIS — Z3009 Encounter for other general counseling and advice on contraception: Secondary | ICD-10-CM | POA: Insufficient documentation

## 2019-12-01 MED ORDER — NORETHINDRONE 0.35 MG PO TABS
1.0000 | ORAL_TABLET | Freq: Every day | ORAL | 12 refills | Status: DC
Start: 2019-12-01 — End: 2020-03-25

## 2019-12-01 MED ORDER — CYCLOBENZAPRINE HCL 10 MG PO TABS
10.0000 mg | ORAL_TABLET | Freq: Three times a day (TID) | ORAL | 1 refills | Status: DC | PRN
Start: 2019-12-01 — End: 2020-02-09

## 2019-12-01 NOTE — Patient Instructions (Signed)

## 2019-12-01 NOTE — Progress Notes (Signed)
    Post Partum Visit Note  Janice Hurley is a 34 y.o. [phone] female who presents for a postpartum visit. She is 4 weeks postpartum following a normal spontaneous vaginal delivery after IOL for oligohydramnios.  I have fully reviewed the prenatal and intrapartum course. The delivery was at [redacted]w[redacted]d gestational weeks.  Anesthesia: none. Postpartum course has been remarkable for a gallstone and will be having surgery.Janice Hurley is doing well. Baby is feeding by breast. Bleeding red. Bowel function is normal. Bladder function is normal. Patient is not sexually active. Contraception method is oral progesterone-only contraceptive. Postpartum depression screening: negative. Migraine Ha- she thinks d/t stress and lack of sleep. Has tried ibuprofen without relief.   The following portions of the patient's history were reviewed and updated as appropriate: allergies, current medications, past family history, past medical history, past social history, past surgical history and problem list.  Review of Systems Pertinent items are noted in HPI.    Objective:  Blood pressure 135/89, pulse 64, resp. rate 16, height 5\' 3"  (1.6 m), weight 161 lb (73 kg), last menstrual period 02/11/2019, currently breastfeeding.  General:  alert, cooperative and appears stated age  Lungs: clear to auscultation bilaterally  Heart:  regular rate and rhythm, S1, S2 normal, no murmur, click, rub or gallop  Abdomen: soft, non-tender; bowel sounds normal; no masses,  no organomegaly        Assessment:   Normal postpartum exam. Pap smear not done at today's visit.  Due next year Migraine HA BC initiation.   Plan:   Essential components of care per ACOG recommendations:  1.  Mood and well being: Patient with negative depression screening today. Reviewed local resources for support.  - Patient does not use tobacco. - hx of drug use? No   2. Infant care and feeding:  -Patient currently breastmilk feeding? Yes If breastmilk feeding  discussed return to work and pumping. If needed, patient was provided letter for work to allow for every 2-3 hr pumping breaks, and to be granted a private location to express breastmilk and refrigerated area to store breastmilk. Reviewed importance of draining breast regularly to support lactation. -Social determinants of health (SDOH) reviewed in EPIC. No concerns were identified  3. Sexuality, contraception and birth spacing - Patient does not want a pregnancy in the next year.  Desired family size is 6 children.  - Reviewed forms of contraception in tiered fashion. Patient desired oral progesterone-only contraceptive today.   - Discussed birth spacing of 18 months  4. Sleep and fatigue -Encouraged family/partner/community support of 4 hrs of uninterrupted sleep to help with mood and fatigue  5. Physical Recovery  - Discussed patients delivery and complications - Patient had a first degree laceration, perineal healing reviewed. Patient expressed understanding - Patient has urinary incontinence? No - Patient is not safe to resume physical and sexual activity  6.  Health Maintenance - Last pap smear done 2018 and was normal with negative HPV.  7. Chronic Disease- 2 hour GTT today.  - PCP follow up  8: Rx flexeril for HA. Encouraged dose of ibuprofen and caffeine.  9: 2 hour GTT today.   2019, NP [facility], [facility]

## 2019-12-02 LAB — GLUCOSE TOLERANCE, 2 HOURS W/ 1HR
Glucose, 1 hour: 85 mg/dL (ref 65–199)
Glucose, 2 hour: 119 mg/dL (ref 65–139)
Glucose, Fasting: 82 mg/dL (ref 65–99)

## 2019-12-04 ENCOUNTER — Telehealth: Payer: Self-pay | Admitting: *Deleted

## 2019-12-04 MED ORDER — FLUCONAZOLE 150 MG PO TABS
150.0000 mg | ORAL_TABLET | Freq: Once | ORAL | 0 refills | Status: AC
Start: 2019-12-04 — End: 2019-12-04

## 2019-12-04 NOTE — Telephone Encounter (Cosign Needed)
Pt is 4 weeks post partum and breast feeding.  She called today stating that her baby has thrush and the pediatrician is suggesting that she get a RX for Diflucan.  She states that she has no symptoms of nipple yeast but per the pediatrician she should call for a RX.

## 2019-12-05 ENCOUNTER — Other Ambulatory Visit: Payer: Self-pay | Admitting: Obstetrics & Gynecology

## 2019-12-05 MED ORDER — FLUCONAZOLE 200 MG PO TABS
ORAL_TABLET | ORAL | 0 refills | Status: DC
Start: 2019-12-05 — End: 2020-01-18

## 2019-12-05 NOTE — Progress Notes (Signed)
Pt reports that infant has thrush and pediatrician treating infant and requesting mother also be treated.  Pt does not report breast symptoms.  Will try one course of diflucan. Pediatrician to send their note for our chart.

## 2019-12-07 ENCOUNTER — Telehealth: Payer: Self-pay

## 2019-12-07 DIAGNOSIS — G43919 Migraine, unspecified, intractable, without status migrainosus: Secondary | ICD-10-CM

## 2019-12-07 MED ORDER — PREDNISONE 50 MG PO TABS: 50.0000 mg | ORAL_TABLET | Freq: Every day | ORAL | 0 refills | Status: DC

## 2019-12-07 NOTE — Telephone Encounter (Signed)
Pt states she has had a migraine x 5 weeks. She is currently breastfeeding. Her OB/GYN gave her Rx cyclobenzaprine and it has not been helping. She was told to try taking the cyclobenzaprine along with ibuprofen 600 mg and drink coffee and that did not help either. She has lots of exhaustion with the 5 kids and the cyclobenzaprine "knocks me out". Tylenol has not been any help at all. She is wanting to know if there is anything Ander Slade can Rx to help.

## 2019-12-07 NOTE — Telephone Encounter (Signed)
Breastfeeding limits what medications we can use but I totally understand the need for relief with 5 kiddos in the house. I can send in a 5 day burst of prednisone but please let her know that this may reduce her milk supply just a bit. If she is okay with this, I will send it to the pharmacy on file.  Thanks, Ander Slade

## 2019-12-07 NOTE — Telephone Encounter (Signed)
Pt aware of Joy's response and would like to try the 5 day prednisone burst. Pharmacy verified in chart.

## 2019-12-07 NOTE — Addendum Note (Signed)
Addended byChristen Butter on: 12/07/2019 10:12 AM   Modules accepted: Orders

## 2020-01-04 ENCOUNTER — Encounter: Payer: Self-pay | Admitting: Medical-Surgical

## 2020-01-04 ENCOUNTER — Ambulatory Visit (INDEPENDENT_AMBULATORY_CARE_PROVIDER_SITE_OTHER): Payer: Managed Care, Other (non HMO) | Admitting: Medical-Surgical

## 2020-01-04 ENCOUNTER — Other Ambulatory Visit: Payer: Self-pay

## 2020-01-04 VITALS — BP 116/78 | HR 79 | Temp 98.2°F | Ht 63.0 in | Wt 159.9 lb

## 2020-01-04 DIAGNOSIS — R7989 Other specified abnormal findings of blood chemistry: Secondary | ICD-10-CM | POA: Diagnosis not present

## 2020-01-04 DIAGNOSIS — R52 Pain, unspecified: Secondary | ICD-10-CM | POA: Diagnosis not present

## 2020-01-04 MED ORDER — SELENIUM SULFIDE 2.25 % EX SHAM: 1.0000 "application " | MEDICATED_SHAMPOO | Freq: Every day | CUTANEOUS | 2 refills | Status: DC

## 2020-01-04 NOTE — Addendum Note (Signed)
Addended byChristen Butter on: 01/04/2020 04:53 PM   Modules accepted: Orders

## 2020-01-04 NOTE — Progress Notes (Signed)
Subjective:    CC: low TSH  HPI: Very pleasant 34 year old female presenting today for discussion of a recent thyroid test that she had completed at [facility].  Rheumatology did some lab work and found that her TSH is low.  She notes that she has been experiencing hand tremors, poor appetite, hair loss, dry skin, and minimal weight changes.  She is 8 weeks postpartum and some of these symptoms were normal.  She notes significant fatigue but denies anxiety/depression/stress.  She has also been attempting to breast-feed her 64-week-old infant but has had to start using formula to supplement as she was not making enough breast milk for adequate growth.  She has had a wide range of physical concerns for the last few years.  She notes widespread joint and muscle pain that is constant.  She has been experiencing generalized weakness and often feels as if she is going to fall while walking.  She does have a history of migraines which have been occurring frequently.  Complains of vision changes where she sees spots intermittently.  She has pain with being touched on her extremities and torso.  She does try to follow a healthy diet as she had multiple complications during her pregnancy including gestational diabetes.  She is also very active person and up until this pregnancy exercise regularly and participated in frequent outdoor activities.  She has been evaluated by rheumatology who did not find any abnormal lab values to explain her condition.  Per patient, rheumatology told her this may be a neurological issue.  Fibromyalgia has been suggested but she does not feel that her symptoms are consistent with that.  She has done some research on her own and is worried that her symptoms may be consistent with MS.  I reviewed the past medical history, family history, social history, surgical history, and allergies today and no changes were needed.  Please see the problem list section below in epic for further  details.  Past Medical History: Past Medical History:  Diagnosis Date  . Anxiety   . Asthma   . Depression   . Diabetes mellitus without complication (HCC)   . Ectopic pregnancy   . GERD (gastroesophageal reflux disease)   . Gestational diabetes   . History of kidney stones   . History of miscarriage   . PCOS (polycystic ovarian syndrome)   . Placenta previa in second trimester 06/19/2019   Resolved on 07/2019 ultrasound   Posterior placenta previa (noted at anatomy US 06/19/2019)  Patient counseled and bleeding precautions given   Plan:  Ultrasound at 32 weeks   Last Assessment & Plan:  Posterior placenta previa (noted at anatomy US 06/19/2019)  Patient previously counseled re: [place] finding; pelvic rest/bleeding precautions given  Plan for repeat ultrasound at 32 weeks  Formatting of this    Past Surgical History: Past Surgical History:  Procedure Laterality Date  . DILATION AND CURETTAGE OF UTERUS    . DILATION AND CURETTAGE OF UTERUS    . ECTOPIC PREGNANCY SURGERY     Social History: Social History   Socioeconomic History  . Marital status: Married    Spouse name: Not on file  . Number of children: Not on file  . Years of education: Not on file  . Highest education level: Not on file  Occupational History  . Not on file  Tobacco Use  . Smoking status: Never Smoker  . Smokeless tobacco: Never Used  Vaping Use  . Vaping Use: Never used  Substance  and Sexual Activity  . Alcohol use: Never  . Drug use: Never  . Sexual activity: Yes    Partners: Male    Birth control/protection: Other-see comments    Comment: recent pregnancy  Other Topics Concern  . Not on file  Social History Narrative  . Not on file   Social Determinants of Health   Financial Resource Strain:   . Difficulty of Paying Living Expenses:   Food Insecurity:   . Worried About Programme researcher, broadcasting/film/video in the Last Year:   . Barista in the Last Year:   Transportation Needs:   . Freight forwarder  (Medical):   Marland Kitchen Lack of Transportation (Non-Medical):   Physical Activity:   . Days of Exercise per Week:   . Minutes of Exercise per Session:   Stress:   . Feeling of Stress :   Social Connections:   . Frequency of Communication with Friends and Family:   . Frequency of Social Gatherings with Friends and Family:   . Attends Religious Services:   . Active Member of Clubs or Organizations:   . Attends Banker Meetings:   Marland Kitchen Marital Status:    Family History: Family History  Problem Relation Age of Onset  . Kidney cancer Father   . Stroke Maternal Grandmother   . Diabetes type I Maternal Grandfather    Allergies: No Known Allergies Medications: See med rec.  Review of Systems: No fevers, chills, night sweats, weight loss, chest pain, palpitations, or shortness of breath.   Objective:    General: Well Developed, well nourished, and in no acute distress.  Neuro: Alert and oriented x3, extra-ocular muscles intact, sensation grossly intact.  Bilateral hand tremors. HEENT: Normocephalic, atraumatic.  No exophthalmos noted. Skin: Warm and dry, no rashes. Cardiac: Regular rate and rhythm, no murmurs rubs or gallops, no lower extremity edema.  Respiratory: Clear to auscultation bilaterally. Not using accessory muscles, speaking in full sentences.   Impression and Recommendations:    1. Low serum thyroid stimulating hormone (TSH) Checking a thyroid panel with TSH today. - Thyroid Panel With TSH  2. Generalized joint and muscle pain With her widespread symptoms, recommend discussing concerns with Neurology. Patient agreeable. Referral entered.  - Ambulatory referral to Neurology  Return if symptoms worsen or fail to improve. ___________________________________________ Thayer Ohm, DNP, APRN, FNP-BC Primary Care and Sports Medicine [facility] [place]

## 2020-01-05 ENCOUNTER — Encounter: Payer: Self-pay | Admitting: Medical-Surgical

## 2020-01-05 LAB — THYROID PANEL WITH TSH
Free Thyroxine Index: 2.8 (ref 1.4–3.8)
T3 Uptake: 32 % (ref 22–35)
T4, Total: 8.9 ug/dL (ref 5.1–11.9)
TSH: 0.4 mIU/L

## 2020-01-05 LAB — SPECIMEN COMPROMISED

## 2020-01-11 NOTE — Telephone Encounter (Signed)
I faxed the the information Joy stated and documented in the referral today

## 2020-01-17 ENCOUNTER — Encounter: Payer: Self-pay | Admitting: Medical-Surgical

## 2020-01-18 ENCOUNTER — Telehealth: Payer: Self-pay | Admitting: Medical-Surgical

## 2020-01-18 MED ORDER — FLUCONAZOLE 200 MG PO TABS
ORAL_TABLET | ORAL | 0 refills | Status: DC
Start: 2020-01-18 — End: 2020-02-09

## 2020-01-18 NOTE — Telephone Encounter (Signed)
Please advise.   Copied from CRM [id number]. Topic: Complaint - Billing/Coding >> Jan 18, 2020  5:15 PM Randol Kern wrote: [name] called requesting a coding adjustment from office. DOS: 09/27/2019 DOS: 10/03/2019 Best contact: [phone] ID# [id number] [name]

## 2020-01-18 NOTE — Addendum Note (Signed)
Addended byChristen Butter on: 01/18/2020 09:43 AM   Modules accepted: Orders

## 2020-01-19 NOTE — Telephone Encounter (Deleted)
Copied from CRM [id number]. Topic: Complaint - Billing/Coding >> Jan 18, 2020  5:15 PM Randol Kern wrote: [name] called requesting a coding adjustment from office. DOS: 09/27/2019 DOS: 10/03/2019 Best contact: [phone] ID# [id number] [name]

## 2020-01-19 NOTE — Telephone Encounter (Signed)
Please advise.   Copied from CRM #[id number]. Topic: Complaint - Billing/Coding >> Jan 18, 2020  5:15 PM Eason, Dominique M wrote: [employer] called requesting a coding adjustment from office. DOS: 09/27/2019 DOS: 10/03/2019 Best contact: [phone] ID# [id number] Mary Ellen  

## 2020-02-04 ENCOUNTER — Encounter: Payer: Self-pay | Admitting: Medical-Surgical

## 2020-02-09 ENCOUNTER — Encounter: Payer: Self-pay | Admitting: Medical-Surgical

## 2020-02-09 ENCOUNTER — Other Ambulatory Visit: Payer: Self-pay

## 2020-02-09 ENCOUNTER — Ambulatory Visit (INDEPENDENT_AMBULATORY_CARE_PROVIDER_SITE_OTHER): Payer: Managed Care, Other (non HMO) | Admitting: Medical-Surgical

## 2020-02-09 ENCOUNTER — Ambulatory Visit (INDEPENDENT_AMBULATORY_CARE_PROVIDER_SITE_OTHER): Payer: Managed Care, Other (non HMO)

## 2020-02-09 VITALS — BP 122/84 | HR 66 | Temp 98.5°F | Ht 63.0 in | Wt 156.0 lb

## 2020-02-09 DIAGNOSIS — R1031 Right lower quadrant pain: Secondary | ICD-10-CM

## 2020-02-09 DIAGNOSIS — Z23 Encounter for immunization: Secondary | ICD-10-CM | POA: Diagnosis not present

## 2020-02-09 DIAGNOSIS — Z87442 Personal history of urinary calculi: Secondary | ICD-10-CM | POA: Diagnosis not present

## 2020-02-09 DIAGNOSIS — R109 Unspecified abdominal pain: Secondary | ICD-10-CM | POA: Diagnosis not present

## 2020-02-09 DIAGNOSIS — R251 Tremor, unspecified: Secondary | ICD-10-CM

## 2020-02-09 DIAGNOSIS — R7989 Other specified abnormal findings of blood chemistry: Secondary | ICD-10-CM

## 2020-02-09 LAB — POCT URINALYSIS DIP (CLINITEK)
Bilirubin, UA: NEGATIVE
Blood, UA: NEGATIVE
Glucose, UA: NEGATIVE mg/dL
Ketones, POC UA: NEGATIVE mg/dL
Leukocytes, UA: NEGATIVE
Nitrite, UA: NEGATIVE
POC PROTEIN,UA: NEGATIVE
Spec Grav, UA: 1.025 (ref 1.010–1.025)
Urobilinogen, UA: 2 E.U./dL — AB
pH, UA: 7 (ref 5.0–8.0)

## 2020-02-09 NOTE — Progress Notes (Signed)
Subjective:    CC: right flank/groin pain  HPI: Pleasant 34 year old female presenting today with complaints of right flank/groin pain.  Notes that she passed a hard pellet-sized kidney stone into the toilet 1 week ago but has continued having pain in the right flank and in her groin.  She is also having urinary hesitancy and has to strain to be able to void.  The symptoms are similar to what she had when she was pregnant with a kidney stone.  She has had significant nausea but no vomiting and poor appetite.  Denies fever, chills, hematuria.  Continues to experience bilateral upper extremity action tremors affecting the forearms, wrists, and hands.  Was evaluated by neurology for all her laboratory results were normal and her MRI brain did not find any abnormalities.  Continues to have no explanation for the tremor but is awaiting evaluation by endocrinology.  The soonest endocrinology appointment she was able to get was November 8.  She is wondering if there is something we can do for further work-up in the meantime.  I reviewed the past medical history, family history, social history, surgical history, and allergies today and no changes were needed.  Please see the problem list section below in epic for further details.  Past Medical History: Past Medical History:  Diagnosis Date  . Anxiety   . Asthma   . Depression   . Diabetes mellitus without complication (HCC)   . Ectopic pregnancy   . GERD (gastroesophageal reflux disease)   . Gestational diabetes   . History of kidney stones   . History of miscarriage   . PCOS (polycystic ovarian syndrome)   . Placenta previa in second trimester 06/19/2019   Resolved on 07/2019 ultrasound   Posterior placenta previa (noted at anatomy US 06/19/2019)  Patient counseled and bleeding precautions given   Plan:  Ultrasound at 32 weeks   Last Assessment & Plan:  Posterior placenta previa (noted at anatomy US 06/19/2019)  Patient previously counseled re: [place]  finding; pelvic rest/bleeding precautions given  Plan for repeat ultrasound at 32 weeks  Formatting of this    Past Surgical History: Past Surgical History:  Procedure Laterality Date  . DILATION AND CURETTAGE OF UTERUS    . DILATION AND CURETTAGE OF UTERUS    . ECTOPIC PREGNANCY SURGERY     Social History: Social History   Socioeconomic History  . Marital status: Married    Spouse name: Not on file  . Number of children: Not on file  . Years of education: Not on file  . Highest education level: Not on file  Occupational History  . Not on file  Tobacco Use  . Smoking status: Never Smoker  . Smokeless tobacco: Never Used  Vaping Use  . Vaping Use: Never used  Substance and Sexual Activity  . Alcohol use: Never  . Drug use: Never  . Sexual activity: Yes    Partners: Male    Birth control/protection: Other-see comments    Comment: recent pregnancy  Other Topics Concern  . Not on file  Social History Narrative  . Not on file   Social Determinants of Health   Financial Resource Strain:   . Difficulty of Paying Living Expenses: Not on file  Food Insecurity:   . Worried About Programme researcher, broadcasting/film/video in the Last Year: Not on file  . Ran Out of Food in the Last Year: Not on file  Transportation Needs:   . Lack of Transportation (Medical): Not on file  .  Lack of Transportation (Non-Medical): Not on file  Physical Activity:   . Days of Exercise per Week: Not on file  . Minutes of Exercise per Session: Not on file  Stress:   . Feeling of Stress : Not on file  Social Connections:   . Frequency of Communication with Friends and Family: Not on file  . Frequency of Social Gatherings with Friends and Family: Not on file  . Attends Religious Services: Not on file  . Active Member of Clubs or Organizations: Not on file  . Attends Banker Meetings: Not on file  . Marital Status: Not on file   Family History: Family History  Problem Relation Age of Onset  . Kidney  cancer Father   . Stroke Maternal Grandmother   . Diabetes type I Maternal Grandfather    Allergies: No Known Allergies Medications: See med rec.  Review of Systems: See HPI for pertinent positives and negatives.   Objective:    General: Well Developed, well nourished, and in no acute distress.  Neuro: Alert and oriented x3. BUE action tremor affecting hands, wrists, and forearms, sparing fingers. HEENT: Normocephalic, atraumatic.  Skin: Warm and dry. Cardiac: Regular rate and rhythm, no murmurs rubs or gallops, no lower extremity edema.  Respiratory: Clear to auscultation bilaterally. Not using accessory muscles, speaking in full sentences. Abdomen: Soft, nondistended. Tenderness to the right flank, RUQ, and RLQ. Left upper and lower quadrants nontender. Bowel sounds + x 4 quadrants. No HSM.  Impression and Recommendations:    1. Acute abdominal pain in right flank/groin pain/urinary hesitancy POCT UA negative for nitrites, leukocytes, and blood. Urobilinogen of 2.0. Checking BMP. With recently passed stone, it's possible that some are remaining. Stat CT renal stone study.  - POCT URINALYSIS DIP (CLINITEK) - BASIC METABOLIC PANEL WITH GFR  2. Low TSH Long wait to get into endocrinology. On review of results, no evaluation for thyroid antibodies so adding to blood work today.   3. Need for influenza vaccination Flu shot given in office by MA today. - Flu Vaccine QUAD 36+ mos IM  4. Tremor of both hands Reviewed available records for lab results that may explain tremor. MRI brain normal and no other lab abnormality identified to explain her tremor. Will check copper level as this is the only thing missing from her previous evaluations.  - Copper, serum  Return if symptoms worsen or fail to improve.  Further follow-up pending CT and blood work results. ___________________________________________ Thayer Ohm, DNP, APRN, FNP-BC Primary Care and Sports Medicine [facility] [place]

## 2020-02-11 ENCOUNTER — Encounter: Payer: Self-pay | Admitting: Medical-Surgical

## 2020-02-11 DIAGNOSIS — R5383 Other fatigue: Secondary | ICD-10-CM

## 2020-02-11 DIAGNOSIS — R7989 Other specified abnormal findings of blood chemistry: Secondary | ICD-10-CM

## 2020-02-11 DIAGNOSIS — R251 Tremor, unspecified: Secondary | ICD-10-CM

## 2020-02-12 ENCOUNTER — Other Ambulatory Visit: Payer: Self-pay | Admitting: Medical-Surgical

## 2020-02-12 DIAGNOSIS — R8290 Unspecified abnormal findings in urine: Secondary | ICD-10-CM

## 2020-02-12 DIAGNOSIS — R944 Abnormal results of kidney function studies: Secondary | ICD-10-CM

## 2020-02-12 LAB — BASIC METABOLIC PANEL WITH GFR
BUN: 9 mg/dL (ref 7–25)
CO2: 29 mmol/L (ref 20–32)
Calcium: 9 mg/dL (ref 8.6–10.2)
Chloride: 105 mmol/L (ref 98–110)
Creat: 1 mg/dL (ref 0.50–1.10)
GFR, Est African American: 85 mL/min/{1.73_m2} (ref >=60)
GFR, Est Non African American: 73 mL/min/{1.73_m2} (ref >=60)
Glucose, Bld: 84 mg/dL (ref 65–99)
Potassium: 3.7 mmol/L (ref 3.5–5.3)
Sodium: 142 mmol/L (ref 135–146)

## 2020-02-12 LAB — THYROID PEROXIDASE ANTIBODY: Thyroperoxidase Ab SerPl-aCnc: <1 IU/mL (ref <9)

## 2020-02-29 LAB — IRON,TIBC AND FERRITIN PANEL
%SAT: 23 % (calc) (ref 16–45)
Ferritin: 15 ng/mL — ABNORMAL LOW (ref 16–154)
Iron: 83 ug/dL (ref 40–190)
TIBC: 366 mcg/dL (calc) (ref 250–450)

## 2020-02-29 LAB — TRANSFERRIN: Transferrin: 285 mg/dL (ref 188–341)

## 2020-02-29 MED ORDER — FERROUS SULFATE 325 (65 FE) MG PO TBEC: 325.0000 mg | DELAYED_RELEASE_TABLET | Freq: Every day | ORAL | 3 refills | Status: AC

## 2020-02-29 NOTE — Telephone Encounter (Signed)
Last read by Briant Sites at 12:43 PM on 02/29/2020.

## 2020-02-29 NOTE — Addendum Note (Signed)
Addended byChristen Butter on: 02/29/2020 12:34 PM   Modules accepted: Orders

## 2020-03-01 DIAGNOSIS — E611 Iron deficiency: Secondary | ICD-10-CM | POA: Insufficient documentation

## 2020-03-04 ENCOUNTER — Encounter: Payer: Self-pay | Admitting: Medical-Surgical

## 2020-03-05 LAB — URINALYSIS W MICROSCOPIC + REFLEX CULTURE
Bacteria, UA: NONE SEEN /HPF
Bilirubin Urine: NEGATIVE
Glucose, UA: NEGATIVE
Hgb urine dipstick: NEGATIVE
Hyaline Cast: NONE SEEN /LPF
Ketones, ur: NEGATIVE
Leukocyte Esterase: NEGATIVE
Nitrites, Initial: NEGATIVE
Protein, ur: NEGATIVE
RBC / HPF: NONE SEEN /HPF (ref 0–2)
Specific Gravity, Urine: 1.019 (ref 1.001–1.03)
WBC, UA: NONE SEEN /HPF (ref 0–5)
pH: 6.5 (ref 5.0–8.0)

## 2020-03-05 LAB — COMPLETE METABOLIC PANEL WITH GFR
AG Ratio: 2.3 (calc) (ref 1.0–2.5)
ALT: 26 U/L (ref 6–29)
AST: 19 U/L (ref 10–30)
Albumin: 4.5 g/dL (ref 3.6–5.1)
Alkaline phosphatase (APISO): 47 U/L (ref 31–125)
BUN: 12 mg/dL (ref 7–25)
CO2: 29 mmol/L (ref 20–32)
Calcium: 9.4 mg/dL (ref 8.6–10.2)
Chloride: 105 mmol/L (ref 98–110)
Creat: 0.96 mg/dL (ref 0.50–1.10)
GFR, Est African American: 89 mL/min/{1.73_m2} (ref >=60)
GFR, Est Non African American: 77 mL/min/{1.73_m2} (ref >=60)
Globulin: 2 g/dL (calc) (ref 1.9–3.7)
Glucose, Bld: 84 mg/dL (ref 65–99)
Potassium: 4.4 mmol/L (ref 3.5–5.3)
Sodium: 141 mmol/L (ref 135–146)
Total Bilirubin: 0.9 mg/dL (ref 0.2–1.2)
Total Protein: 6.5 g/dL (ref 6.1–8.1)

## 2020-03-05 LAB — NO CULTURE INDICATED

## 2020-03-12 ENCOUNTER — Encounter: Payer: Self-pay | Admitting: Medical-Surgical

## 2020-03-25 ENCOUNTER — Other Ambulatory Visit: Payer: Self-pay

## 2020-03-25 ENCOUNTER — Other Ambulatory Visit (HOSPITAL_COMMUNITY)
Admission: RE | Admit: 2020-03-25 | Discharge: 2020-03-25 | Disposition: A | Discharge status: Home / Self Care | Payer: Managed Care, Other (non HMO) | Source: Ambulatory Visit | Attending: Obstetrics & Gynecology | Admitting: Obstetrics & Gynecology

## 2020-03-25 ENCOUNTER — Ambulatory Visit: Payer: Managed Care, Other (non HMO) | Admitting: Obstetrics & Gynecology

## 2020-03-25 ENCOUNTER — Encounter: Payer: Self-pay | Admitting: Obstetrics & Gynecology

## 2020-03-25 VITALS — BP 127/82 | HR 88 | Ht 63.0 in | Wt 159.0 lb

## 2020-03-25 DIAGNOSIS — R102 Pelvic and perineal pain: Secondary | ICD-10-CM

## 2020-03-25 DIAGNOSIS — G43909 Migraine, unspecified, not intractable, without status migrainosus: Secondary | ICD-10-CM

## 2020-03-25 DIAGNOSIS — R350 Frequency of micturition: Secondary | ICD-10-CM | POA: Diagnosis not present

## 2020-03-25 DIAGNOSIS — E0590 Thyrotoxicosis, unspecified without thyrotoxic crisis or storm: Secondary | ICD-10-CM | POA: Insufficient documentation

## 2020-03-25 LAB — POCT URINALYSIS DIPSTICK
Bilirubin, UA: NEGATIVE
Glucose, UA: NEGATIVE
Ketones, UA: NEGATIVE
Nitrite, UA: NEGATIVE
Protein, UA: NEGATIVE
Spec Grav, UA: 1.01 (ref 1.010–1.025)
Urobilinogen, UA: 0.2 E.U./dL
pH, UA: 5 (ref 5.0–8.0)

## 2020-03-25 LAB — POCT URINE PREGNANCY: Preg Test, Ur: NEGATIVE

## 2020-03-25 NOTE — Progress Notes (Signed)
Subjective:    Patient ID: Janice Hurley, female    DOB: 04-17-1986, 34 y.o.   MRN: [id number]  HPI  34 yo female presents with 3 week history of vaginal bleeding, dyspareunia and pelvic pain.  Pt seeing an endocrinologist for hyperthyroidism (getting worked up for graves) and neurologist for migraines and MD (getting MRI) Breast feed once a day(had supply issues).  Pt cut back breast feeding at 19 months of age (which was 2 months ago).  Pt started POP at 4 weeks which she recently stopped 3 weeks ago.  After she stopped she has been bleeding ever since.  Uses panty liners.  Blood goes from light BRB spotting to brown. Pt feels that she is also bloated recently as well.  Pt feeling pain 7/10 in pelvis.  Laying on back is worse; pain worse at night.  Tylenol can make pain better.  Pt is trying not to take NSAIDS due to kidney function (GFR in 70).  Pt has PCO but was told it is not due to insulin resistance.   Pt has pain with deep penetration during intercourse.  Denies insertional pain.  Uses coitus interruptus for family planning as she is wanting to stay away from hormones.   Pt having constipation issues and having colonoscopy next week.  Pt also having increased frequency of urine.  UA and PCP has been negative.  She urinates frequently through the day and night.  Voids are small.  Pt also c/o vaginal discharge and wetness.   This is in addition to sometimes having stress urinary incontinence.    Review of Systems  Constitutional: Positive for fatigue and unexpected weight change.  Gastrointestinal: Positive for abdominal distention, abdominal pain and constipation.  Genitourinary: Positive for dyspareunia, menstrual problem, pelvic pain, vaginal bleeding and vaginal discharge.  Musculoskeletal: Positive for arthralgias.  Psychiatric/Behavioral:       Frustrated and angry from lack of diagnosis       Objective:   Physical Exam Vitals reviewed.  Constitutional:      General: She is not  in acute distress.    Appearance: She is well-developed.  HENT:     Head: Normocephalic and atraumatic.  Eyes:     Conjunctiva/sclera: Conjunctivae normal.  Cardiovascular:     Rate and Rhythm: Normal rate.  Pulmonary:     Effort: Pulmonary effort is normal.  Abdominal:     General: There is no distension.     Palpations: Abdomen is soft.     Tenderness: There is no abdominal tenderness. There is no guarding or rebound.  Genitourinary:    Comments: Tanner V Vulva:  No lesion Vagina:  Pink, no lesions, yellowish discharge, no blood Cervix:  No CMT Uterus:  tender, mobile, anteverted, small Right adnexa--tender, no mass Left adnexa--tender, no mass  Bedside US shows bladder is empty after voiding.  Skin:    General: Skin is warm and dry.  Neurological:     Mental Status: She is alert and oriented to person, place, and time.    Vitals:   03/25/20 0926  BP: 127/82  Pulse: 88  Weight: 159 lb (72.1 kg)  Height: 5\' 3"  (1.6 m)       Assessment & Plan:  34 yo female with: 1.  Vaginal discharge--Aptima 2.  Pelvic pain, bloating, menstrual irregularities--TVUS and Abdominal 20 3.  Urinary frequency--voiding/intake diary 4.  Continue GI, neuro and endocrine workups.  42 minutes spent reviewing records form multiple health systems, labs, imaging as well as patient  exam and discussion of possible etiologies of complaints and documentation.

## 2020-03-25 NOTE — Progress Notes (Signed)
Pt stopped OCP three weeks ago and has had vaginal pain and spotting since stopping OCP

## 2020-03-26 LAB — CERVICOVAGINAL ANCILLARY ONLY
Bacterial Vaginitis (gardnerella): NEGATIVE
Candida Glabrata: NEGATIVE
Candida Vaginitis: NEGATIVE
Chlamydia: NEGATIVE
Comment: NEGATIVE
Comment: NEGATIVE
Comment: NEGATIVE
Comment: NEGATIVE
Comment: NEGATIVE
Comment: NORMAL
Neisseria Gonorrhea: NEGATIVE
Trichomonas: NEGATIVE

## 2020-03-28 LAB — HM COLONOSCOPY

## 2020-04-08 ENCOUNTER — Other Ambulatory Visit: Payer: Self-pay

## 2020-04-08 ENCOUNTER — Ambulatory Visit (HOSPITAL_BASED_OUTPATIENT_CLINIC_OR_DEPARTMENT_OTHER)
Admission: RE | Admit: 2020-04-08 | Discharge: 2020-04-08 | Disposition: A | Discharge status: Home / Self Care | Payer: Managed Care, Other (non HMO) | Source: Ambulatory Visit | Attending: Obstetrics & Gynecology | Admitting: Obstetrics & Gynecology

## 2020-04-08 ENCOUNTER — Ambulatory Visit: Payer: Managed Care, Other (non HMO)

## 2020-04-08 DIAGNOSIS — R102 Pelvic and perineal pain: Secondary | ICD-10-CM | POA: Insufficient documentation

## 2020-04-10 ENCOUNTER — Telehealth (INDEPENDENT_AMBULATORY_CARE_PROVIDER_SITE_OTHER): Payer: Managed Care, Other (non HMO) | Admitting: Obstetrics & Gynecology

## 2020-04-10 DIAGNOSIS — N83202 Unspecified ovarian cyst, left side: Secondary | ICD-10-CM

## 2020-04-10 DIAGNOSIS — N921 Excessive and frequent menstruation with irregular cycle: Secondary | ICD-10-CM

## 2020-04-10 DIAGNOSIS — G43809 Other migraine, not intractable, without status migrainosus: Secondary | ICD-10-CM

## 2020-04-10 DIAGNOSIS — R102 Pelvic and perineal pain: Secondary | ICD-10-CM | POA: Diagnosis not present

## 2020-04-10 DIAGNOSIS — E282 Polycystic ovarian syndrome: Secondary | ICD-10-CM

## 2020-04-11 ENCOUNTER — Encounter: Payer: Self-pay | Admitting: Medical-Surgical

## 2020-04-15 ENCOUNTER — Encounter: Payer: Self-pay | Admitting: Obstetrics & Gynecology

## 2020-04-15 NOTE — Progress Notes (Signed)
TELEHEALTH GYNECOLOGY VISIT ENCOUNTER NOTE  I connected with Briant Sites on 04/15/20 at 10:00 AM EDT by telephone at home and verified that I am speaking with the correct person using two identifiers.  Pt is located at home and I am located at [address] office.   I discussed the limitations, risks, security and privacy concerns of performing Hurley evaluation and management service by telephone and the availability of in person appointments. I also discussed with the patient that there may be a patient responsible charge related to this service. The patient expressed understanding and agreed to proceed.   History:  Janice Hurley is a 34 y.o. [phone] female being evaluated today continue uterine bleeding and to discuss the US findings.  Pt usually never ovulates and doesn't understand how there could be a functional cyst on her ovary.  Her lining is very thin at 2 mm.  Pt does not do well with hormones so has declined OCPs and other hormonal birth control.  Pt wants to hve ovary removed.     Past Medical History:  Diagnosis Date  . Anxiety   . Asthma   . Depression   . Diabetes mellitus without complication (HCC)   . Ectopic pregnancy   . GERD (gastroesophageal reflux disease)   . Gestational diabetes   . History of kidney stones   . History of miscarriage   . PCOS (polycystic ovarian syndrome)   . Placenta previa in second trimester 06/19/2019   Resolved on 07/2019 ultrasound   Posterior placenta previa (noted at anatomy US 06/19/2019)  Patient counseled and bleeding precautions given   Plan:  Ultrasound at 32 weeks   Last Assessment & Plan:  Posterior placenta previa (noted at anatomy US 06/19/2019)  Patient previously counseled re: [place] finding; pelvic rest/bleeding precautions given  Plan for repeat ultrasound at 32 weeks  Formatting of this    Past Surgical History:  Procedure Laterality Date  . DILATION AND CURETTAGE OF UTERUS    . DILATION AND CURETTAGE OF UTERUS    . ECTOPIC  PREGNANCY SURGERY     The following portions of the patient's history were reviewed and updated as appropriate: allergies, current medications, past family history, past medical history, past social history, past surgical history and problem list.    Review of Systems:  Pertinent items noted in HPI and remainder of comprehensive ROS otherwise negative.  Physical Exam:   General:  Alert, oriented and cooperative.   Mental Status: Normal mood and affect perceived. Normal judgment and thought content.  Physical exam deferred due to nature of the encounter  Labs and Imaging No results found for this or any previous visit (from the past 336 hour(s)). US PELVIC COMPLETE WITH TRANSVAGINAL  Result Date: 04/08/2020 CLINICAL DATA:  Pelvic pain, 5 months postpartum, LMP 03/27/2020, history of fertility treatments EXAM: TRANSABDOMINAL AND TRANSVAGINAL ULTRASOUND OF PELVIS TECHNIQUE: Both transabdominal and transvaginal ultrasound examinations of the pelvis were performed. Transabdominal technique was performed for global imaging of the pelvis including uterus, ovaries, adnexal regions, and pelvic cul-de-sac. It was necessary to proceed with endovaginal exam following the transabdominal exam to visualize the uterus, endometrium, and ovaries. COMPARISON:  None Correlation: CT abdomen and pelvis 02/09/2020 FINDINGS: Uterus Measurements: 8.0 x 3.0 x 4.7 cm = volume: 57 mL. Anteverted. Normal morphology without mass Endometrium Thickness: 2 mm.  No endometrial fluid or focal abnormality Right ovary Measurements: 3.5 x 2.3 x 2.5 cm = volume: 10.8 mL. Normal morphology without mass Left ovary  Measurements: 4.1 x 3.4 x 4.2 cm = volume: 30.3 mL. Complicated cyst within LEFT ovary 3.6 x 3.2 x 3.7 cm containing dependent echogenicity/debris. Adjacent bowel loops, some of which cause shadowing. Other findings No free pelvic fluid.  No additional pelvic masses. IMPRESSION: Unremarkable uterus, endometrial complex and  RIGHT ovary. Complicated cyst within LEFT ovary 3.6 cm greatest size containing dependent echogenicity/debris or sequela of prior hemorrhage; short-interval follow up ultrasound in 6-12 weeks is recommended, preferably during the week following the patient's normal menses. This recommendation follows the consensus statement: Management of Asymptomatic Ovarian and Other Adnexal Cysts Imaged at [place]: Society of Radiologists in Ultrasound Consensus Conference Statement. Electronically Signed   By: Ulyses Southward M.D.   On: 04/08/2020 15:47      Assessment and Plan:     1. Pelvic pain Pain most likely from hemorrhagic ovarian cyst.  Pt very upset that conservative management is best choice and that surgery to remove ovary is not advised.  There is no evidence of torsion.     2. Menometrorrhagia Discussed option of endometrial biopsy if we wanted to rule out any possibility of abnormal cells in the uterus.  Pt does have history of PCOS. - US PELVIC COMPLETE WITH TRANSVAGINAL; Future  5. Left ovarian cyst Will et follow up US in 6 weeks.  If cyst still persistent will get CA 125. - US PELVIC COMPLETE WITH TRANSVAGINAL; Future  I discussed the assessment and treatment plan with the patient. The patient was provided Hurley opportunity to ask questions and all were answered. The patient agreed with the plan and demonstrated Hurley understanding of the instructions.   The patient was advised to call back or seek Hurley in-person evaluation/go to the ED if the symptoms worsen or if the condition fails to improve as anticipated.  I provided 45 minutes of non-face-to-face time during this encounter.  This includes review records with patient, counseling about functional cysts, dysfunctional uterine bleeding, ovarian cancer, endometrial cancer, hemorrhagic cysts, treatments for irregular menstrual bleeding.   Elsie Lincoln, MD [facility] [employer], [facility]

## 2020-04-17 ENCOUNTER — Ambulatory Visit: Payer: Managed Care, Other (non HMO)

## 2020-04-29 ENCOUNTER — Other Ambulatory Visit: Payer: Managed Care, Other (non HMO) | Admitting: Obstetrics & Gynecology

## 2020-05-22 ENCOUNTER — Encounter: Payer: Self-pay | Admitting: Medical-Surgical

## 2020-05-22 ENCOUNTER — Ambulatory Visit (INDEPENDENT_AMBULATORY_CARE_PROVIDER_SITE_OTHER): Payer: Managed Care, Other (non HMO) | Admitting: Medical-Surgical

## 2020-05-22 ENCOUNTER — Other Ambulatory Visit: Payer: Self-pay

## 2020-05-22 VITALS — BP 111/75 | HR 70 | Temp 99.0°F | Ht 63.0 in | Wt 156.4 lb

## 2020-05-22 DIAGNOSIS — E611 Iron deficiency: Secondary | ICD-10-CM

## 2020-05-22 DIAGNOSIS — N926 Irregular menstruation, unspecified: Secondary | ICD-10-CM

## 2020-05-22 DIAGNOSIS — N289 Disorder of kidney and ureter, unspecified: Secondary | ICD-10-CM

## 2020-05-22 DIAGNOSIS — R5383 Other fatigue: Secondary | ICD-10-CM

## 2020-05-22 DIAGNOSIS — L659 Nonscarring hair loss, unspecified: Secondary | ICD-10-CM

## 2020-05-22 DIAGNOSIS — R52 Pain, unspecified: Secondary | ICD-10-CM | POA: Diagnosis not present

## 2020-05-22 NOTE — Progress Notes (Signed)
Subjective:    CC: follow up  HPI: Pleasant 34 year old female accompanied by her precious 76 month old daughter presents today for follow up of the following:   Thyroid- was seen by endocrinology who has rechecked her levels and done an ultrasound of her thyroid. At last check her TSH was low again but her T4 was normal. She reports he told her to have her thyroid rechecked with her PCP and to return to him if it becomes less than 0.1.   Hair loss- has continued to have hair loss since delivery. Feels that she is eating healthier than she ever has and is unsure why the hair loss has continued. Is taking iron supplements as directed.   Iron deficiency- has been taking iron as prescribed daily, tolerating well. Would like to have her iron rechecked today. Has three of her five children come back as iron deficient recently. Wants to know if she is responding to replacement or not.  Irregular menses- history of PCOS with oligomenorrhea and irregular menses. Since her last baby was born 36 months ago, she has bled more than she ever has. Her periods are sometimes heavy and very unpredictable. She has had a TVUS recently to evaluate an ovarian cyst and noted that her uterine lining was very thin but the day after she started to bleed again. Her most recent period was 6-7 days ago and stopped yesterday. This one came only 1 week after the previous one stopped. Wonders if she may be peri-menopausal at an early age.   Continues to have widespread body pain, fatigue, and other non-specific symptoms that have not been explained. She was evaluated by Rheumatology earlier this year during her pregnancy and when all of her labs came back negative at that time, she feels that she has hit a wall with them for further evaluation. She has a referral in place to see a Radio producer for a second opinion. Is interested in what testing and/or treatment that we can run in primary care.   Kidney function-  wondering about her kidney function after having a drastic reduction in her GFR earlier this year. Her function was still considered normal but it was concerning that she had such a big drop for no apparent reason. Voiding normally with no reduction in urine volume.   I reviewed the past medical history, family history, social history, surgical history, and allergies today and no changes were needed.  Please see the problem list section below in epic for further details.  Past Medical History: Past Medical History:  Diagnosis Date  . Anxiety   . Asthma   . Depression   . Diabetes mellitus without complication ([place])   . Ectopic pregnancy   . GERD (gastroesophageal reflux disease)   . Gestational diabetes   . History of kidney stones   . History of miscarriage   . PCOS (polycystic ovarian syndrome)   . Placenta previa in second trimester 06/19/2019   Resolved on 07/2019 ultrasound   Posterior placenta previa (noted at anatomy US 06/19/2019)  Patient counseled and bleeding precautions given   Plan:  Ultrasound at 32 weeks   Last Assessment & Plan:  Posterior placenta previa (noted at anatomy US 06/19/2019)  Patient previously counseled re: [place] finding; pelvic rest/bleeding precautions given  Plan for repeat ultrasound at 32 weeks  Formatting of this    Past Surgical History: Past Surgical History:  Procedure Laterality Date  . DILATION AND CURETTAGE OF UTERUS    . DILATION AND  CURETTAGE OF UTERUS    . ECTOPIC PREGNANCY SURGERY     Social History: Social History   Socioeconomic History  . Marital status: Married    Spouse name: Not on file  . Number of children: Not on file  . Years of education: Not on file  . Highest education level: Not on file  Occupational History  . Not on file  Tobacco Use  . Smoking status: Never Smoker  . Smokeless tobacco: Never Used  Vaping Use  . Vaping Use: Never used  Substance and Sexual Activity  . Alcohol use: Never  . Drug use: Never  . Sexual  activity: Yes    Partners: Male    Birth control/protection: Other-see comments    Comment: recent pregnancy  Other Topics Concern  . Not on file  Social History Narrative  . Not on file   Social Determinants of Health   Financial Resource Strain:   . Difficulty of Paying Living Expenses: Not on file  Food Insecurity:   . Worried About Charity fundraiser in the Last Year: Not on file  . Ran Out of Food in the Last Year: Not on file  Transportation Needs:   . Lack of Transportation (Medical): Not on file  . Lack of Transportation (Non-Medical): Not on file  Physical Activity:   . Days of Exercise per Week: Not on file  . Minutes of Exercise per Session: Not on file  Stress:   . Feeling of Stress : Not on file  Social Connections:   . Frequency of Communication with Friends and Family: Not on file  . Frequency of Social Gatherings with Friends and Family: Not on file  . Attends Religious Services: Not on file  . Active Member of Clubs or Organizations: Not on file  . Attends Archivist Meetings: Not on file  . Marital Status: Not on file   Family History: Family History  Problem Relation Age of Onset  . Kidney cancer Father   . Stroke Maternal Grandmother   . Diabetes type I Maternal Grandfather    Allergies: No Known Allergies Medications: See med rec.  Review of Systems: See HPI for pertinent positives and negatives.   Objective:    General: Well Developed, well nourished, and in no acute distress.  Neuro: Alert and oriented x3.  HEENT: Normocephalic, atraumatic.  Skin: Warm and dry. Cardiac: Regular rate and rhythm, no murmurs rubs or gallops, no lower extremity edema.  Respiratory: Clear to auscultation bilaterally. Not using accessory muscles, speaking in full sentences.  Impression and Recommendations:    1. Irregular menses Checking labs as below. Consider premature ovarian failure vs PCOS pattern changes vs effects of aging and multiple  pregnancies. Metformin has not helped much so far but has only been on this a couple of weeks. Not interested in hormonal options. May benefit from Lysteda trial if her bleeding becomes heavy/prolonged again. May also consider Megace but feel that the weight gain associated with this may be undesirable enough to rule it out.  - Estradiol - FSH/LH - Prolactin - Testosterone - TSH + free T4  2. Generalized body aches/fatigue Since her wait time for the new Rheumatology appointment is a few months away, we will go ahead and check some basic labs today.  - Sed Rate (ESR) - C-reactive protein - Rheumatoid Factor - ANA  3. Iron deficiency Checking iron panel. Continue daily iron supplement.  - Fe+TIBC+Fer  4. Hair loss Checking BMP. Possible relation to  vitamin deficiency, thyroid, iron deficiency, or hormonal changes. If no immediate cause noted in labs, may benefit from evaluation by dermatology as they can examine the hair shafts and roots for contributing factors or causes.  - BASIC METABOLIC PANEL WITH GFR - TSH + free T4  5. Decreased renal function Checking BMP with GFR. - BASIC METABOLIC PANEL WITH GFR  Return for follow up pending lab results and plan. ___________________________________________ Clearnce Sorrel, DNP, APRN, FNP-BC [facility] [place]

## 2020-05-23 ENCOUNTER — Encounter: Payer: Self-pay | Admitting: Medical-Surgical

## 2020-05-23 DIAGNOSIS — E611 Iron deficiency: Secondary | ICD-10-CM

## 2020-05-24 LAB — IRON,TIBC AND FERRITIN PANEL
%SAT: 15 % (calc) — ABNORMAL LOW (ref 16–45)
Ferritin: 14 ng/mL — ABNORMAL LOW (ref 16–154)
Iron: 55 ug/dL (ref 40–190)
TIBC: 374 mcg/dL (calc) (ref 250–450)

## 2020-05-24 LAB — BASIC METABOLIC PANEL WITH GFR
BUN: 14 mg/dL (ref 7–25)
CO2: 29 mmol/L (ref 20–32)
Calcium: 9.8 mg/dL (ref 8.6–10.2)
Chloride: 104 mmol/L (ref 98–110)
Creat: 0.86 mg/dL (ref 0.50–1.10)
GFR, Est African American: 102 mL/min/{1.73_m2} (ref >=60)
GFR, Est Non African American: 88 mL/min/{1.73_m2} (ref >=60)
Glucose, Bld: 84 mg/dL (ref 65–99)
Potassium: 4.2 mmol/L (ref 3.5–5.3)
Sodium: 141 mmol/L (ref 135–146)

## 2020-05-24 LAB — SEDIMENTATION RATE: Sed Rate: 2 mm/h (ref 0–20)

## 2020-05-24 LAB — TESTOSTERONE, TOTAL, LC/MS/MS: Testosterone, Total, LC-MS-MS: 16 ng/dL (ref 2–45)

## 2020-05-24 LAB — FSH/LH
FSH: 5.2 m[IU]/mL
LH: 7.2 m[IU]/mL

## 2020-05-24 LAB — ANA: Anti Nuclear Antibody (ANA): NEGATIVE

## 2020-05-24 LAB — C-REACTIVE PROTEIN: CRP: 2.8 mg/L (ref <8.0)

## 2020-05-24 LAB — PROLACTIN: Prolactin: 6.5 ng/mL

## 2020-05-24 LAB — TSH+FREE T4: TSH W/REFLEX TO FT4: 0.77 mIU/L

## 2020-05-24 LAB — ESTRADIOL: Estradiol: 112 pg/mL

## 2020-05-24 LAB — RHEUMATOID FACTOR: Rheumatoid fact SerPl-aCnc: <14 IU/mL (ref <14)

## 2020-05-24 MED ORDER — DULOXETINE HCL 20 MG PO CPEP: 20.0000 mg | ORAL_CAPSULE | Freq: Every day | ORAL | 1 refills | Status: DC

## 2020-05-31 LAB — HM SIGMOIDOSCOPY

## 2020-06-13 DIAGNOSIS — Z9109 Other allergy status, other than to drugs and biological substances: Secondary | ICD-10-CM | POA: Insufficient documentation

## 2020-06-13 DIAGNOSIS — Z641 Problems related to multiparity: Secondary | ICD-10-CM | POA: Insufficient documentation

## 2020-06-17 ENCOUNTER — Other Ambulatory Visit: Payer: Self-pay | Admitting: Medical-Surgical

## 2020-07-01 ENCOUNTER — Encounter: Payer: Self-pay | Admitting: Medical-Surgical

## 2020-07-25 DIAGNOSIS — K900 Celiac disease: Secondary | ICD-10-CM | POA: Insufficient documentation

## 2020-07-26 ENCOUNTER — Encounter: Payer: Self-pay | Admitting: Medical-Surgical

## 2021-01-09 DIAGNOSIS — T148XXA Other injury of unspecified body region, initial encounter: Secondary | ICD-10-CM | POA: Insufficient documentation

## 2021-05-13 ENCOUNTER — Telehealth: Payer: Self-pay

## 2021-05-13 NOTE — Telephone Encounter (Signed)
Transition Care Management Follow-up Telephone Call Date of discharge and from where: 05/12/2021 from [facility] How have you been since you were released from the hospital? Pt stated that she is feeling well and did not have any questions at this time.  Any questions or concerns? No  Items Reviewed: Did the pt receive and understand the discharge instructions provided? Yes  Medications obtained and verified? Yes  Other? No  Any new allergies since your discharge? No  Dietary orders reviewed? No Do you have support at home? Yes   Functional Questionnaire: (I = Independent and D = Dependent) ADLs: I  Bathing/Dressing- I  Meal Prep- I  Eating- I  Maintaining continence- I  Transferring/Ambulation- I  Managing Meds- I   Follow up appointments reviewed:  PCP Hospital f/u appt confirmed? No   Specialist Hospital f/u appt confirmed?  No Pt is calling OBGYN for an appt. Are transportation arrangements needed? No  If their condition worsens, is the pt aware to call PCP or go to the Emergency Dept.? Yes Was the patient provided with contact information for the PCP's office or ED? Yes Was to pt encouraged to call back with questions or concerns? Yes

## 2021-06-07 IMAGING — US US MFM OB DETAIL+14 WK
1 series · 12 of 28 positions shown · non-contrast
Comparison: none

[Series 1: us mfm ob detail+14 wk · 121 acquisitions, 12 frames shown]
[im 5/121]
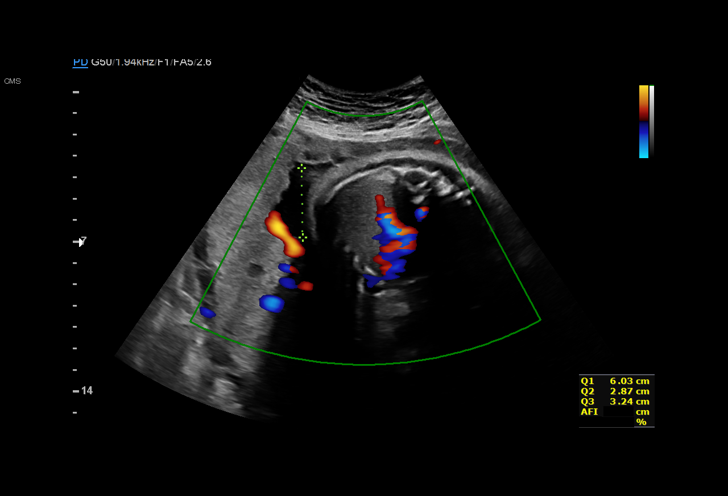
[im 14/121]
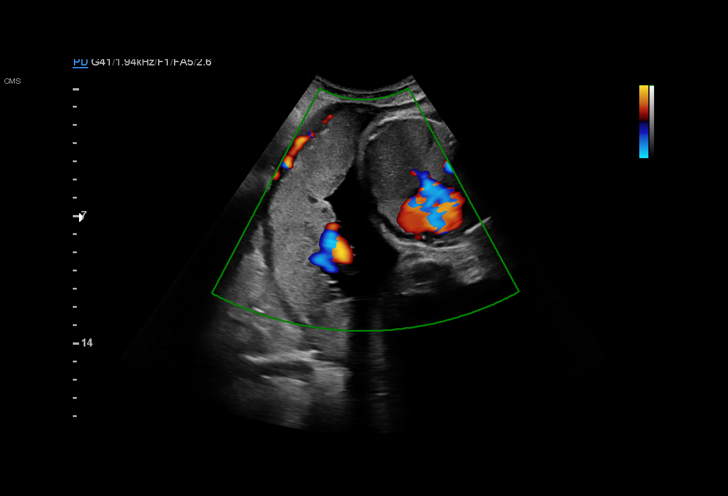
[im 23/121]
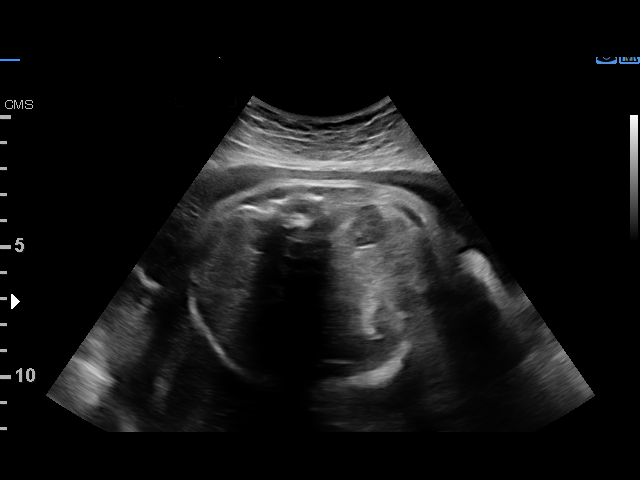
[im 36/121]
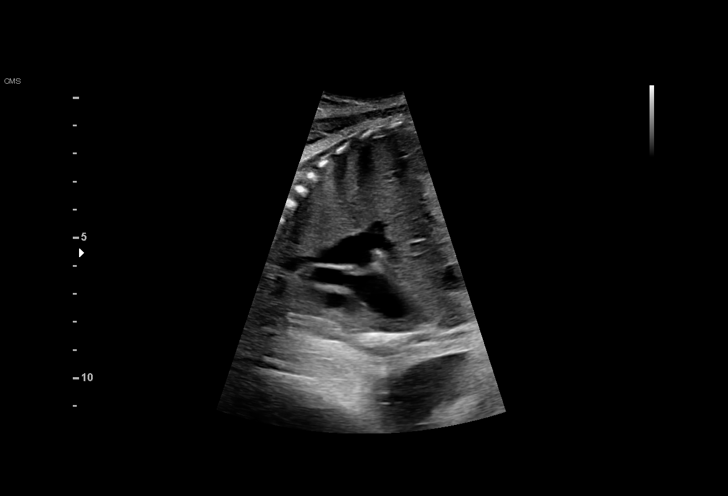
[im 45/121]
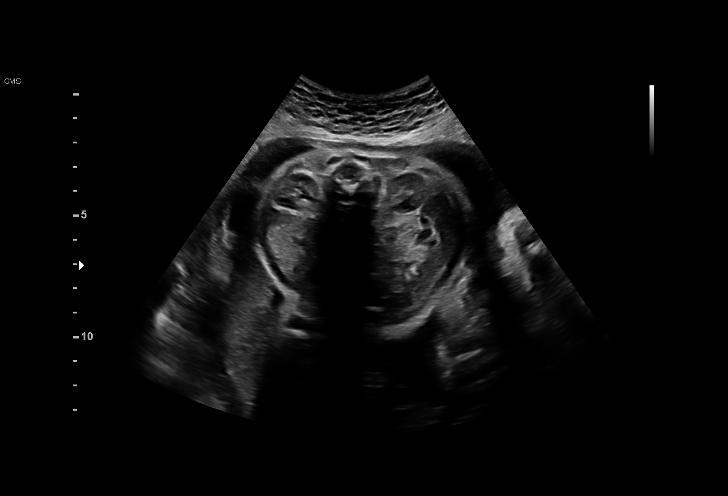
[im 54/121]
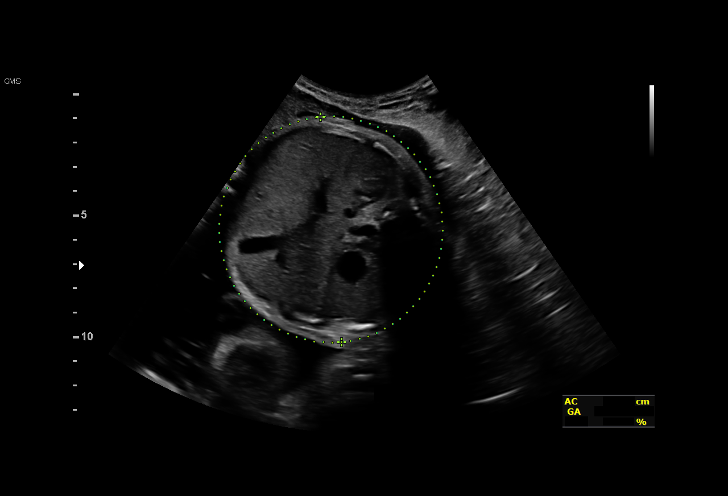
[im 67/121]
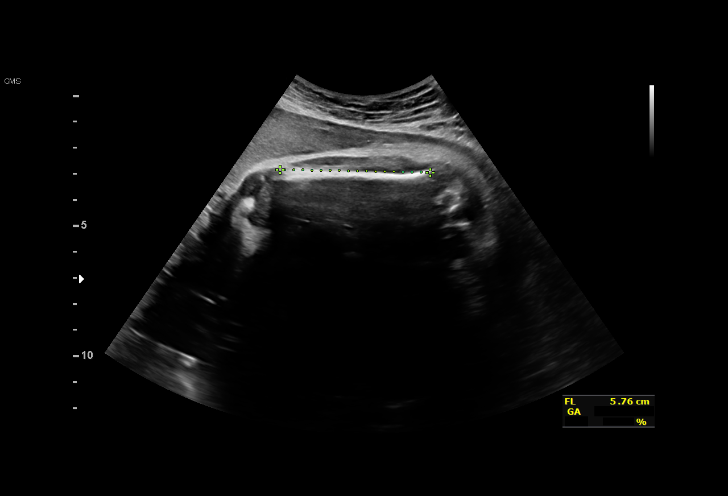
[im 76/121]
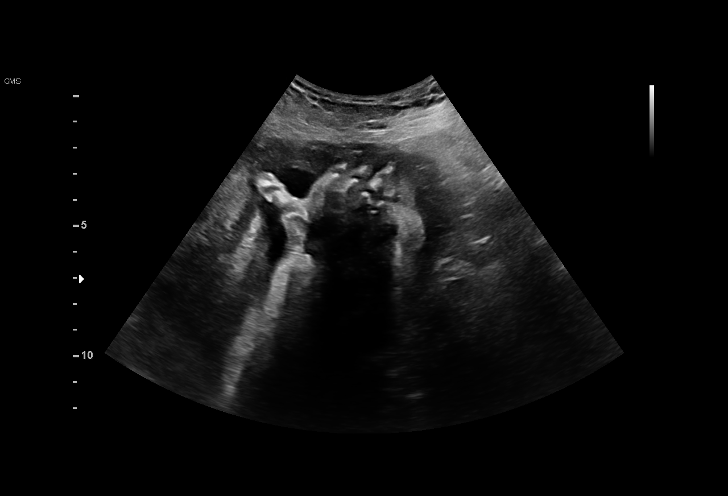
[im 85/121]
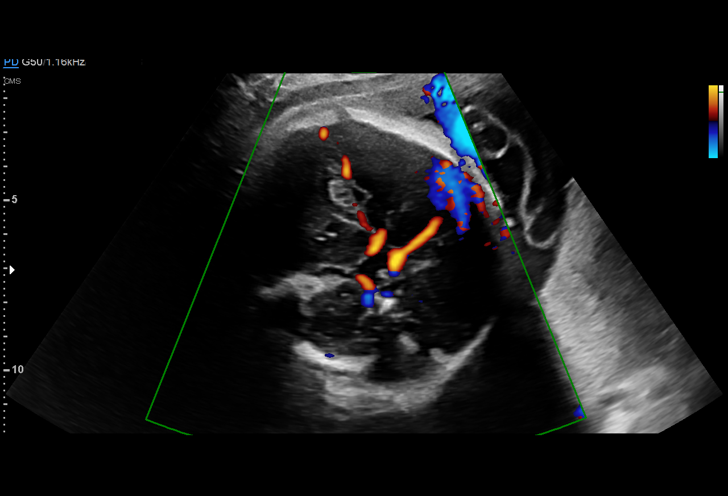
[im 98/121]
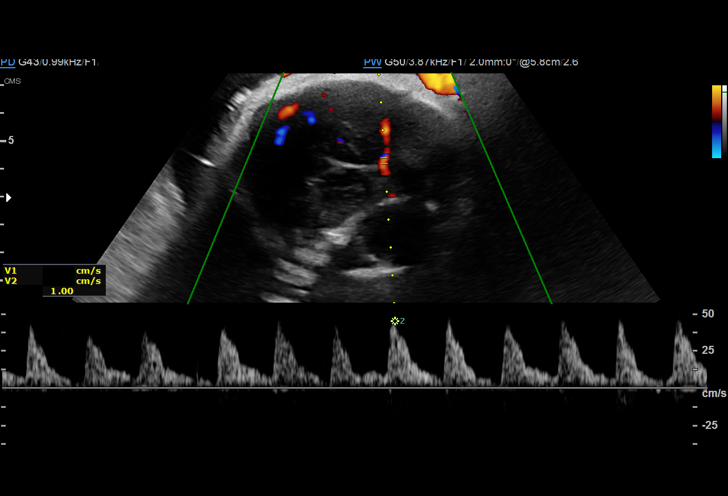
[im 107/121]
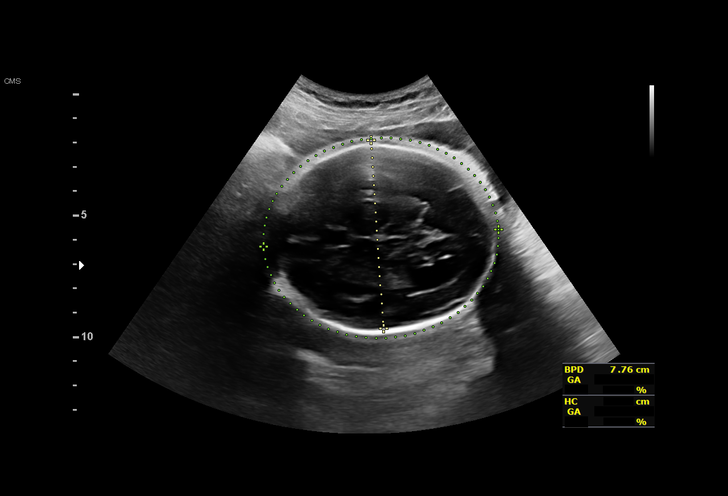
[im 116/121]
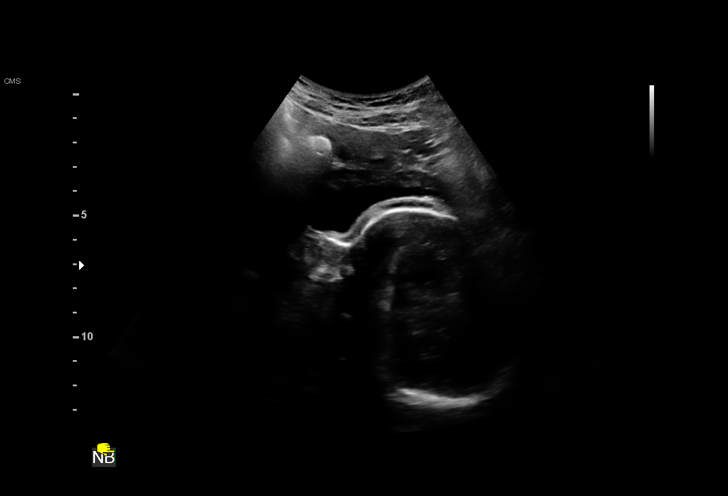

[12 of 28 positions shown; findings below may reference images not displayed]

----------------------------------------------------------------------

 ----------------------------------------------------------------------
Indications

  30 weeks gestation of pregnancy
  Antenatal screening for malformations
  Maternal care for anti-D Rh antibodies,
  third trimester, unspecified
  Poor obstetric history: Previous preterm
  delivery, antepartum (36+6 weeks)
  Poor obstetric history: Previous gestational
  HTN
  Gestational diabetes in pregnancy,
  controlled by oral hypoglycemic drugs
  (metformin)
 ----------------------------------------------------------------------
Fetal Evaluation

 Num Of Fetuses:         1
 Fetal Heart Rate(bpm):  152
 Cardiac Activity:       Observed
 Presentation:           Cephalic
 Placenta:               Posterior
 P. Cord Insertion:      Visualized

 Amniotic Fluid
 AFI FV:      Within normal limits

 AFI Sum(cm)     %Tile       Largest Pocket(cm)
 13.79           45

 RUQ(cm)       RLQ(cm)       LUQ(cm)        LLQ(cm)

Biometry
 BPD:      77.2  mm     G. Age:  31w 0d         56  %    CI:        75.71   %    70 - 86
                                                         FL/HC:      20.4   %    19.2 -
 HC:      281.3  mm     G. Age:  30w 6d         25  %    HC/AC:      0.98        0.99 -
 AC:      286.4  mm     G. Age:  32w 5d         95  %    FL/BPD:     74.5   %    71 - 87
 FL:       57.5  mm     G. Age:  30w 1d         27  %    FL/AC:      20.1   %    20 - 24
 HUM:        50  mm     G. Age:  29w 2d         29  %

 Est. FW:    8723  gm    3 lb 15 oz      77  %
OB History

 Gravidity:    9         Term:   3        Prem:   1        SAB:   3
 TOP:          0       Ectopic:  1        Living: 4
Gestational Age

 LMP:           30w 3d        Date:  02/11/19                 EDD:   11/18/19
 U/S Today:     31w 1d                                        EDD:   11/13/19
 Best:          30w 3d     Det. By:  LMP  (02/11/19)          EDD:   11/18/19
Anatomy

 Cranium:               Appears normal         LVOT:                   Appears normal
 Cavum:                 Appears normal         Aortic Arch:            Appears normal
 Ventricles:            Bilat.ventriculomeg    Ductal Arch:            Not well visualized
                        [place],  Rt 10  Lt 10
 Choroid Plexus:        Appears normal         Diaphragm:              Appears normal
 Cerebellum:            Appears normal         Stomach:                Appears normal, left
                                                                       sided
 Posterior Fossa:       Appears normal         Abdomen:                Appears normal
 Nuchal Fold:           Not applicable (>20    Abdominal Wall:         Not well visualized
                        wks GA)
 Face:                  Appears normal         Cord Vessels:           Appears normal (3
                        (orbits and profile)                           vessel cord)
 Lips:                  Appears normal         Kidneys:                Appear normal
 Palate:                Not well visualized    Bladder:                Appears normal
 Thoracic:              Appears normal         Spine:                  Appears normal
 Heart:                 Appears normal         Upper Extremities:      Visualized
                        (4CH, axis, and
                        situs)
 RVOT:                  Appears normal         Lower Extremities:      Appears normal

 Other:  Heels and right open hand/5th digit visualized. Nasal bone visualized.
         Technically difficult due to advanced GA and fetal position.
Doppler - Fetal Vessels

 Middle Cerebral Artery
                                                    PSV    MoM
                                                  (cm/s)
                                                      47

Cervix Uterus Adnexa

 Cervix
 Length:            3.1  cm.
 Normal appearance by transabdominal scan.
 Uterus
 No abnormality visualized.

 Left Ovary
 Not visualized.

 Right Ovary
 Within normal limits.

 Cul De Sac
 No free fluid seen.

 Adnexa
 No abnormality visualized.
Impression

 Ms. Seng, [id number] at 30w 3d is here for fetal anatomy scan
 and middle-cerebral artery (MCA) Doppler study.
 She had MFM consultation yesterday because of the finding
 of anti-D antibodies on routine screening (see my
 consultation in [REDACTED]).  Antibody titers were <[DATE].
 Patient has gestational diabetes and started taking metformin
 500 mg at night from yesterday.
 On today's ultrasound, fetal growth is appropriate for
 gestational age.  Amniotic fluid is normal and good fetal
 activity seen.  Mild bilateral ventriculomegaly were seen in
 each ventricle measuring 10 mm.  Rest of the intracranial
 structures appear normal.  No other obvious fetal structural
 defects are seen.
 Middle cerebral artery Doppler showed normal peak systolic
 velocity measurements (no evidence of anemia.
 I explained the findings and reassured her that isolated
 ventriculomegaly is usually associated with good neonatal
 outcomes.  It is also likely to resolve with advancing
 gestation.  However we will continue to monitor for any
 progression.
Recommendations

 -An appointment was made for her to return in 2 weeks for
 BPP and MCA Doppler.
 -MCA Doppler studies every 2 weeks provided peak systolic
 velocity measurements are normal.
 -Weekly BPP from 32 weeks till delivery.
                 [place], [place]

## 2021-06-29 IMAGING — US US RENAL
1 series · 15 of 24 positions shown · non-contrast
Comparison: None.

CLINICAL DATA: Bilateral flank pain for 2 days, history of left
renal calculi, 33 weeks pregnant

EXAM:
RENAL / URINARY TRACT ULTRASOUND COMPLETE

[Series 1: us renal · 15 of 24 slices shown]
[im 1/24]
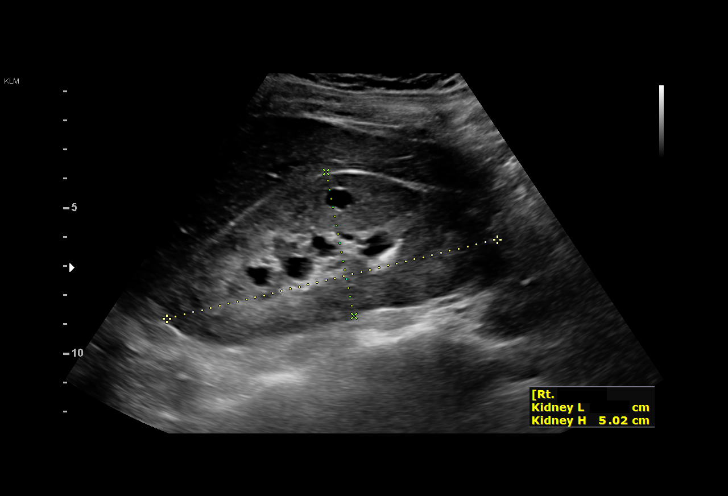
[im 3/24]
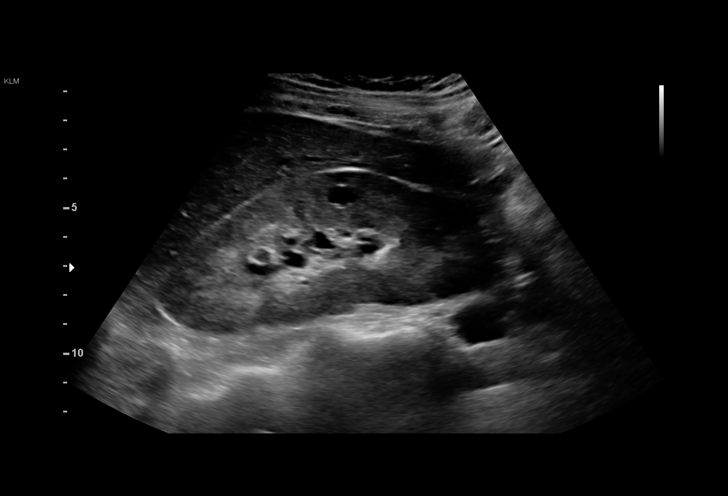
[im 5/24]
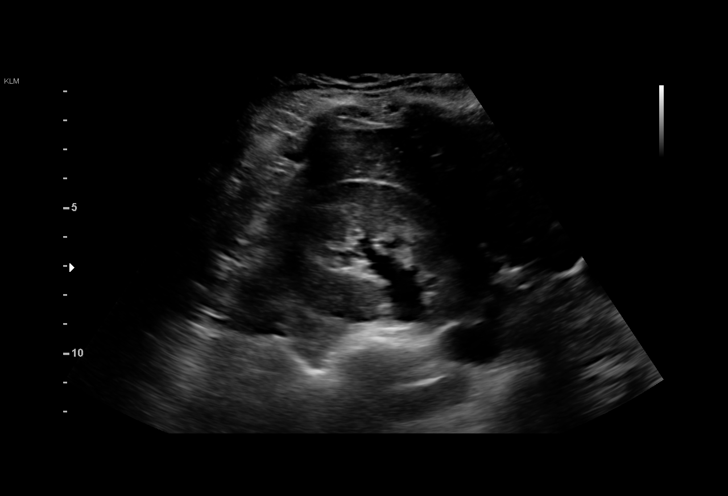
[im 6/24]
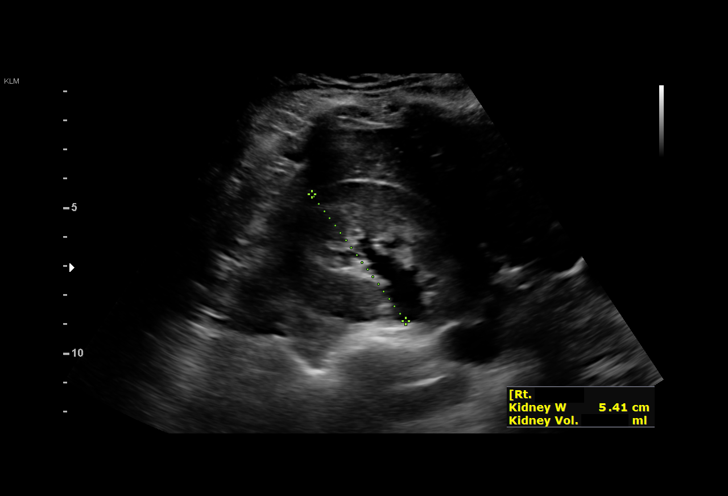
[im 8/24]
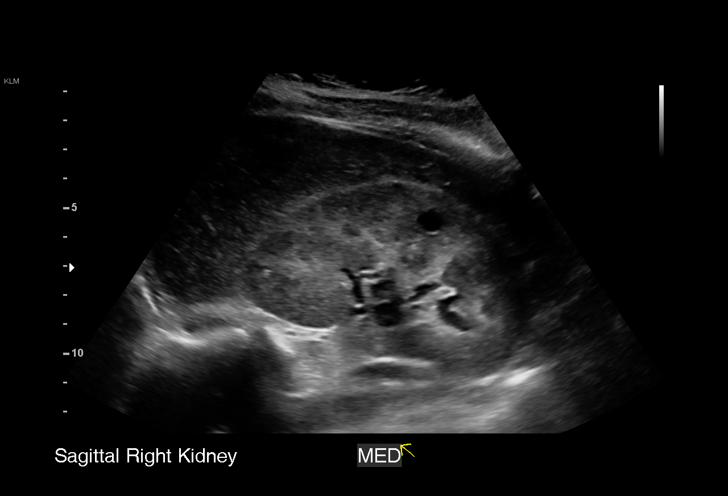
[im 9/24]
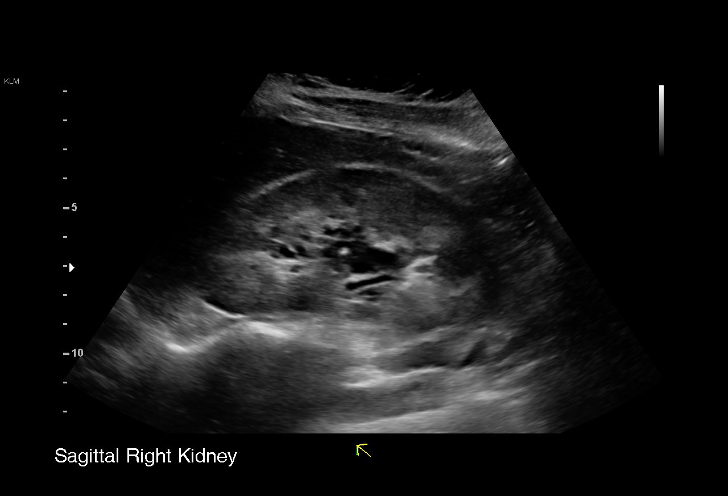
[im 11/24]
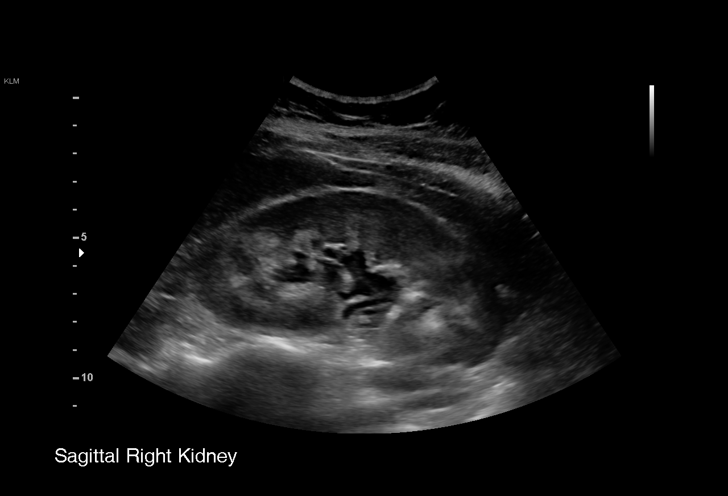
[im 13/24]
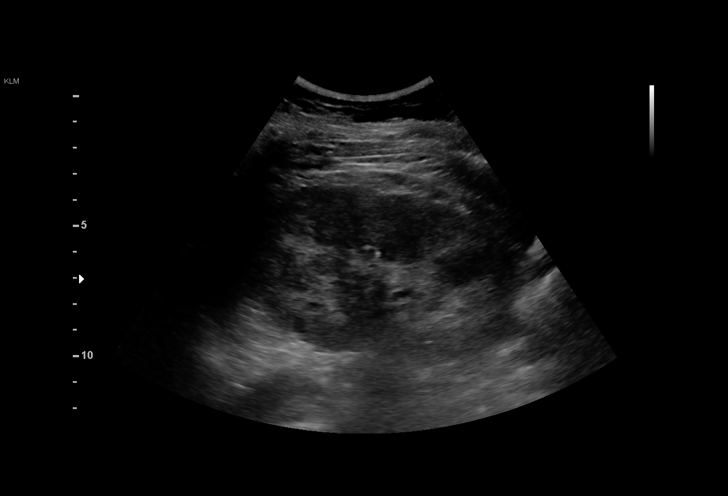
[im 14/24]
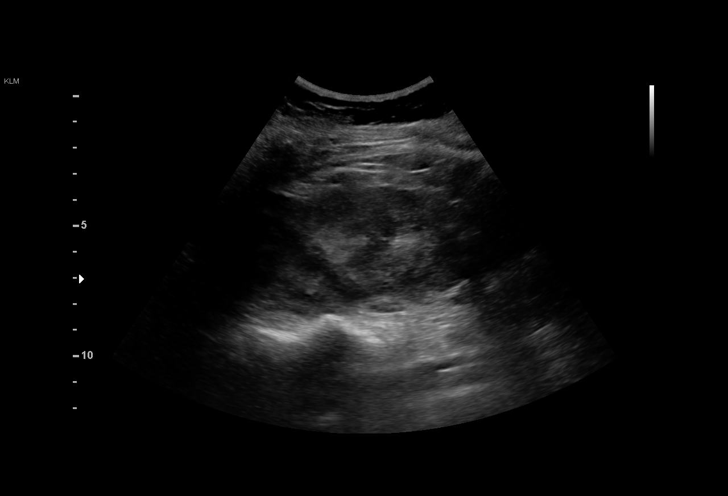
[im 16/24]
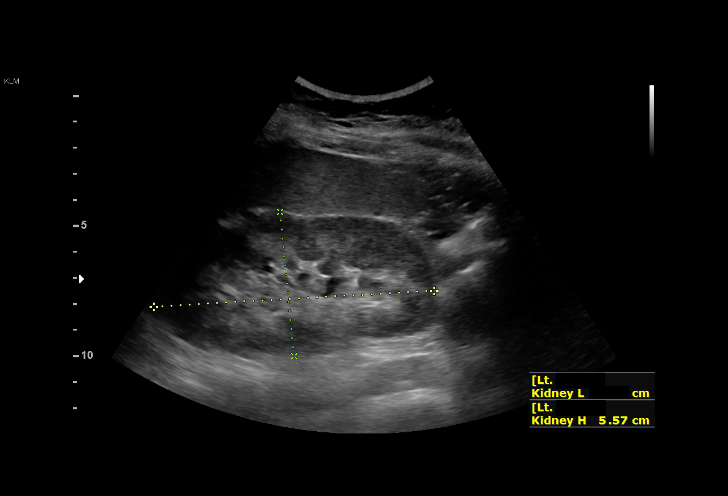
[im 17/24]
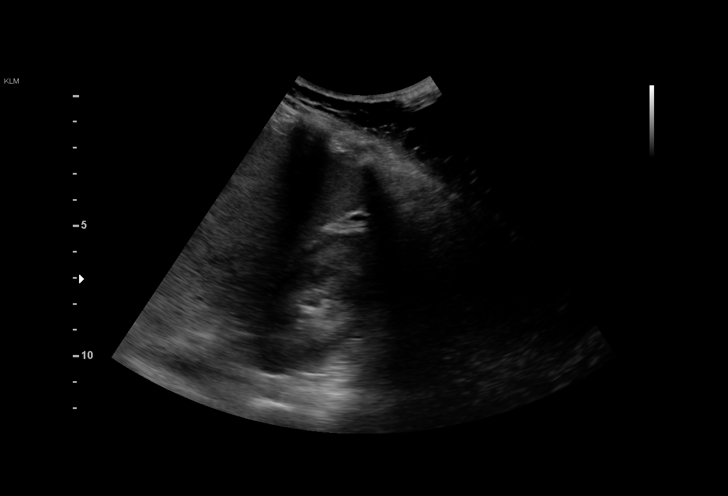
[im 19/24]
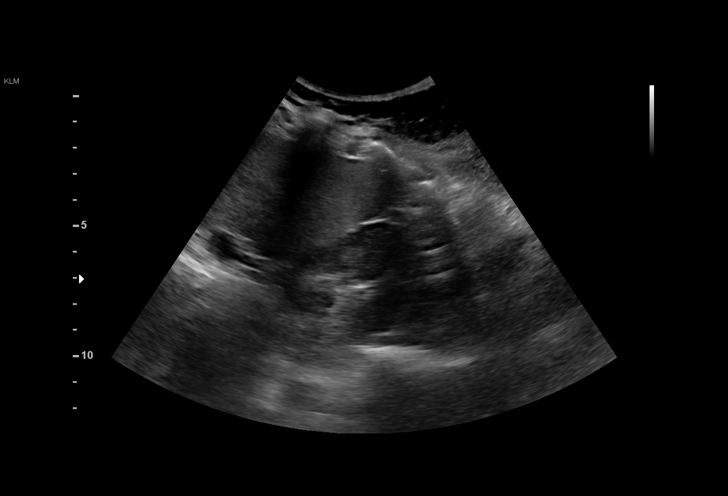
[im 21/24]
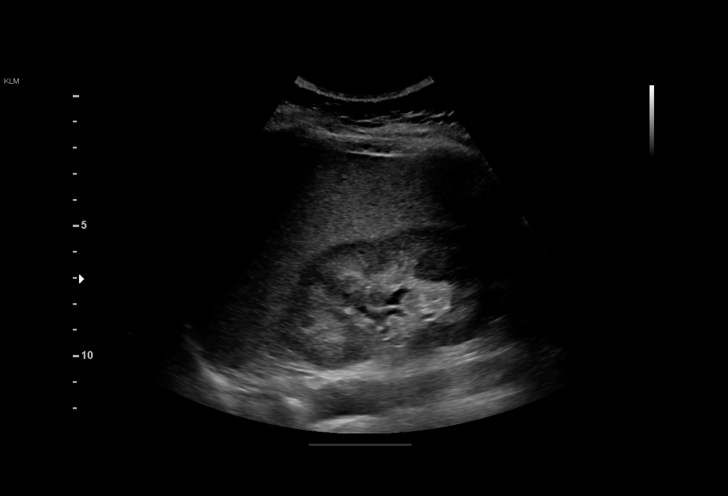
[im 22/24]
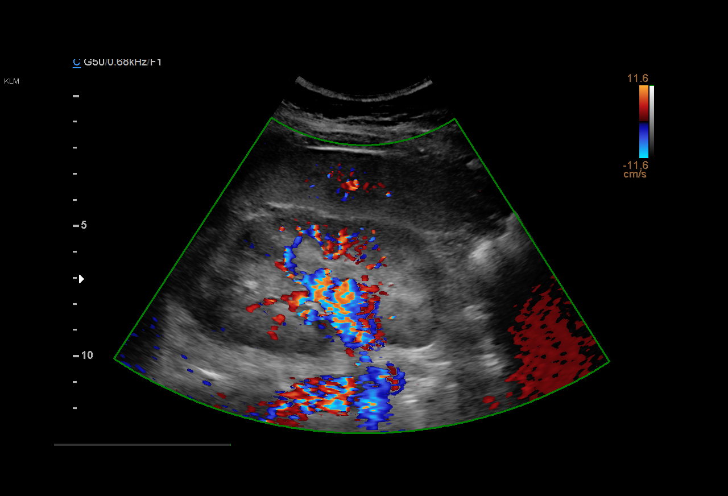
[im 24/24]
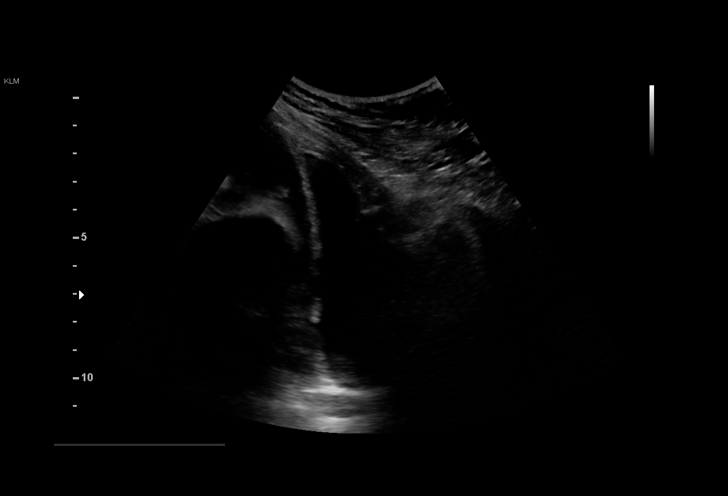

[15 of 24 positions shown; findings below may reference images not displayed]

FINDINGS: Right Kidney:

Renal measurements: 11.6 x 5.0 x 5.1 cm = volume: 166 mL.
Echotexture is normal. There is a 1 cm simple cortical cyst in the
interpolar region. There is moderate right hydronephrosis which may
be functional given gravid uterus. No urinary tract calculi.

Left Kidney:

Renal measurements: 10.8 x 5.6 x 4.8 cm = volume: 152 mL.
Echotexture is normal. No hydronephrosis or nephrolithiasis.

Bladder:

Appears normal for degree of bladder distention.

Other:

None.
IMPRESSION: 1. Moderate right hydronephrosis, which could be functional given
gravid uterus.
2. No evidence of nephrolithiasis.

## 2021-07-22 IMAGING — US US MFM FETAL BPP W/O NON-STRESS
1 series · 14 of 23 positions shown · non-contrast
Comparison: none

[Series 1: us mfm fetal bpp w/o non-stress · 23 acquisitions, 14 frames shown]
[im 1/23]
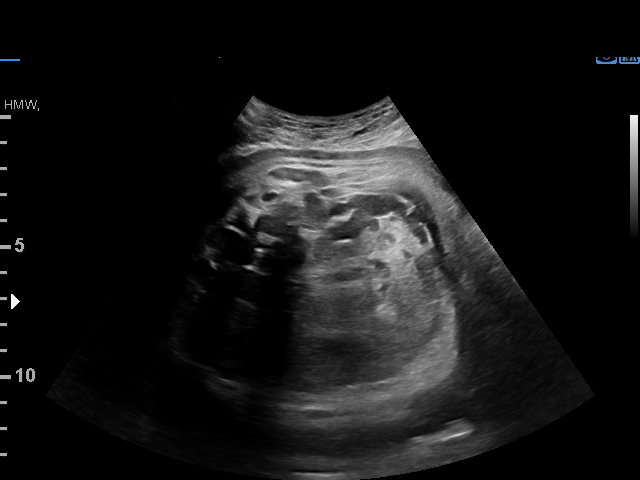
[im 3/23]
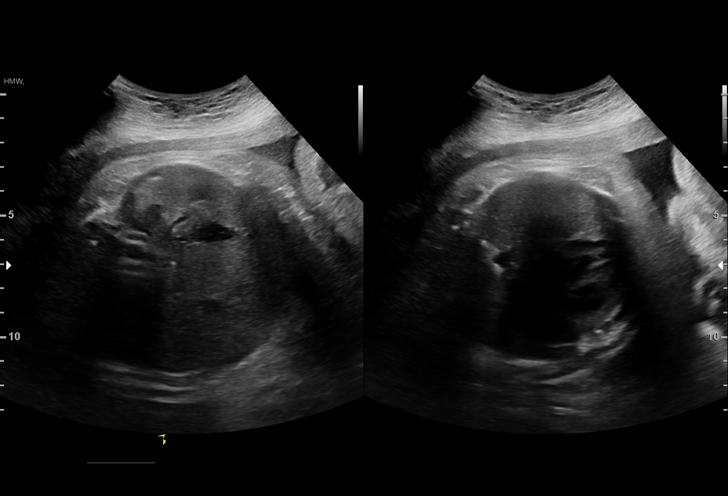
[im 5/23]
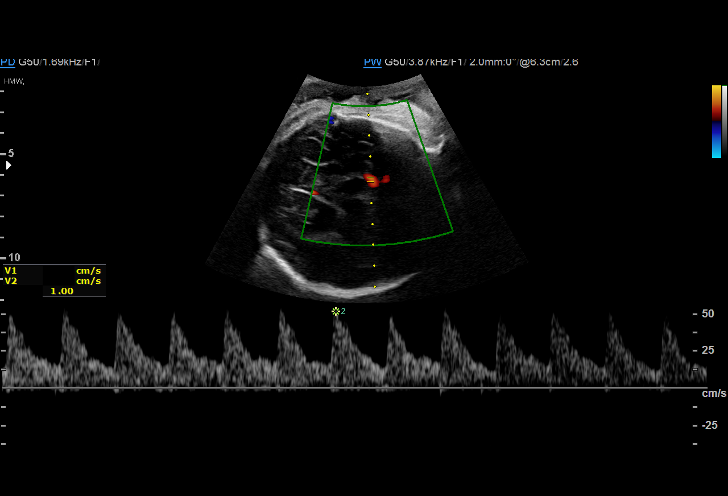
[im 6/23]
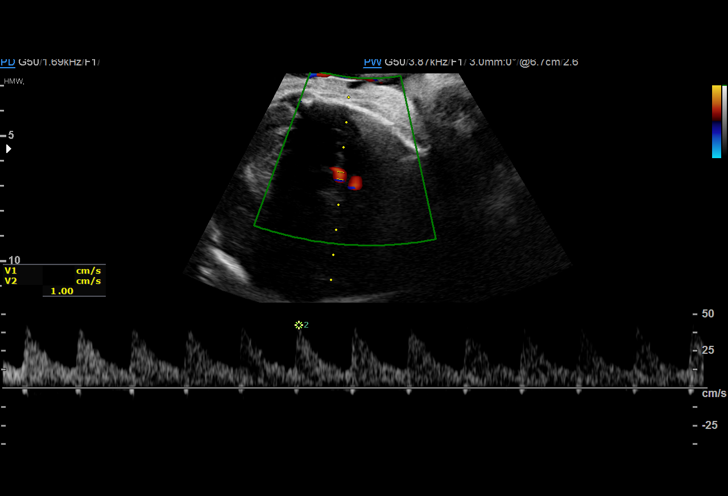
[im 8/23]
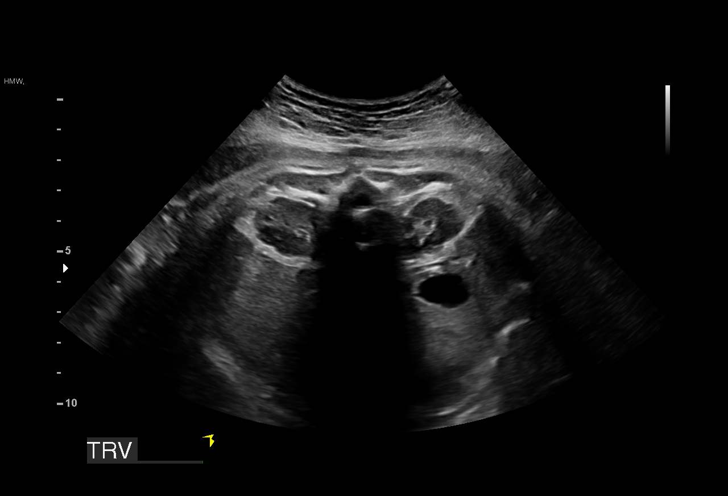
[im 10/23]
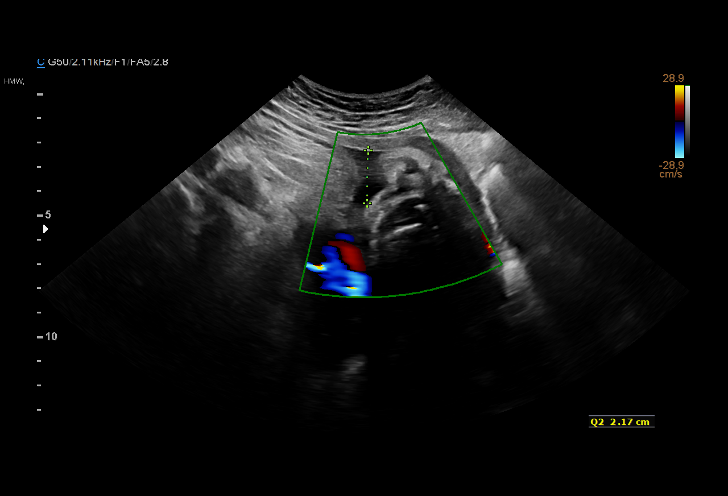
[im 11/23]
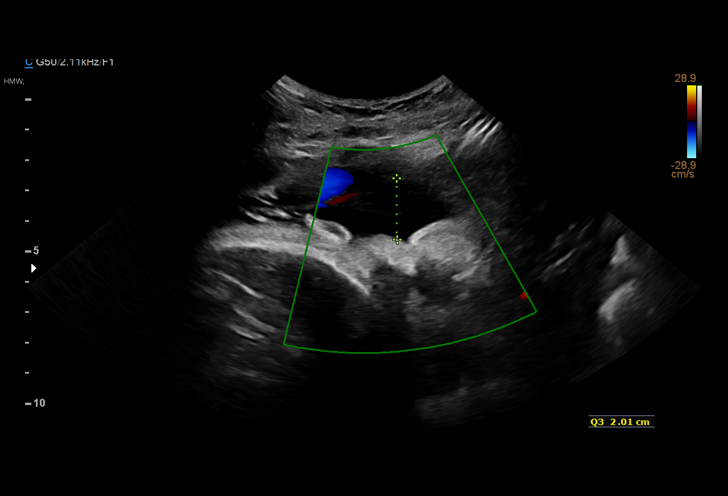
[im 13/23]
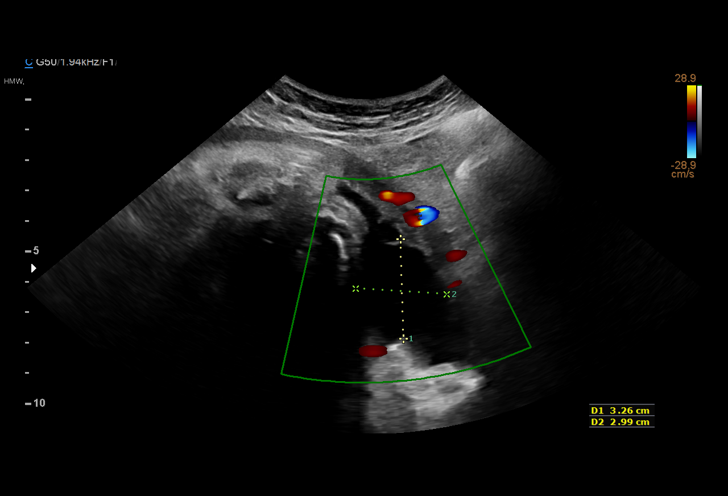
[im 14/23]
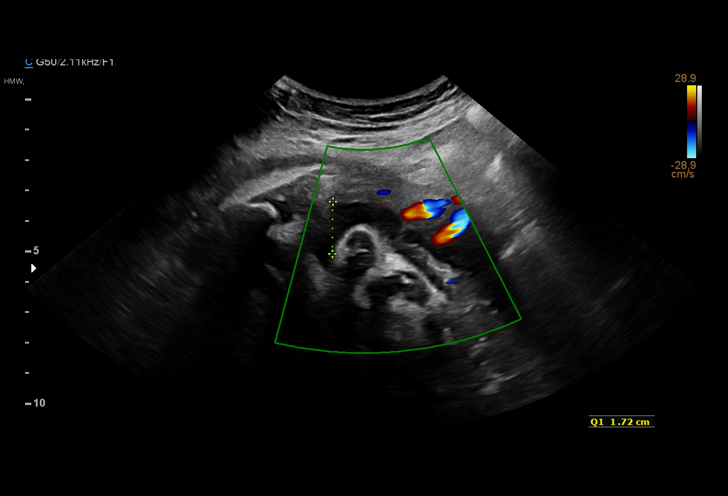
[im 16/23]
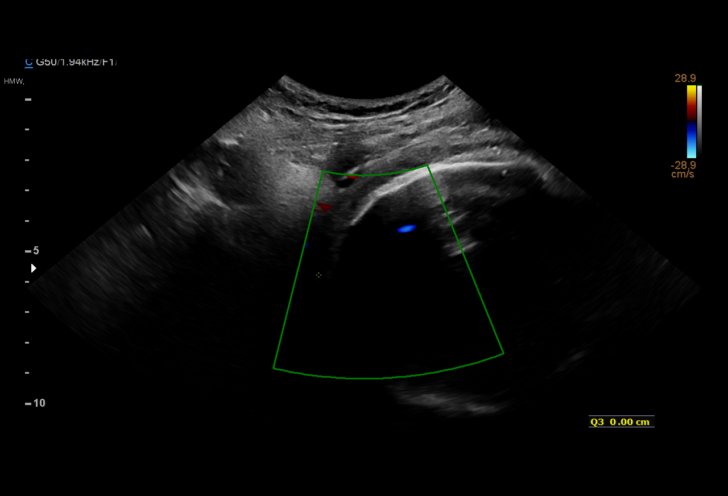
[im 18/23]
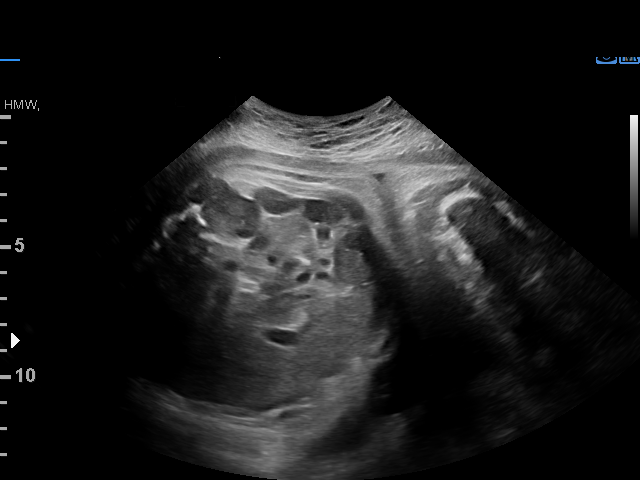
[im 19/23]
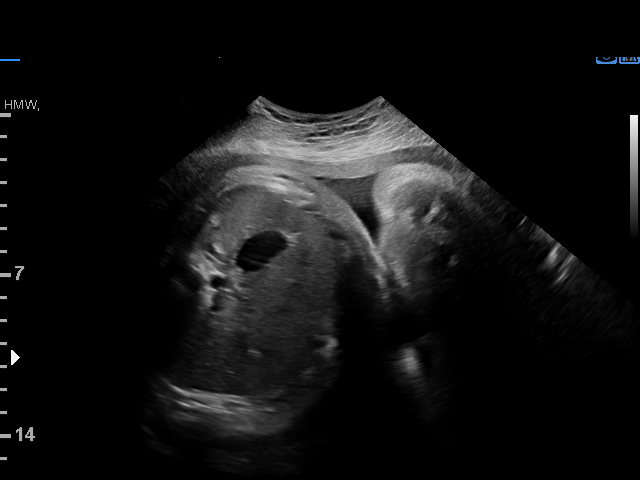
[im 21/23]
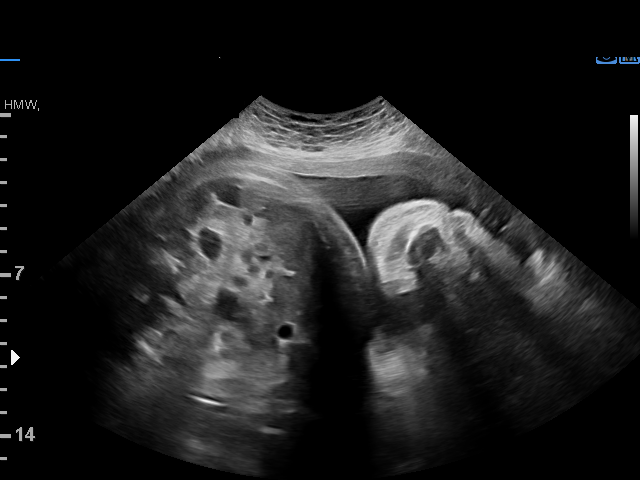
[im 23/23]
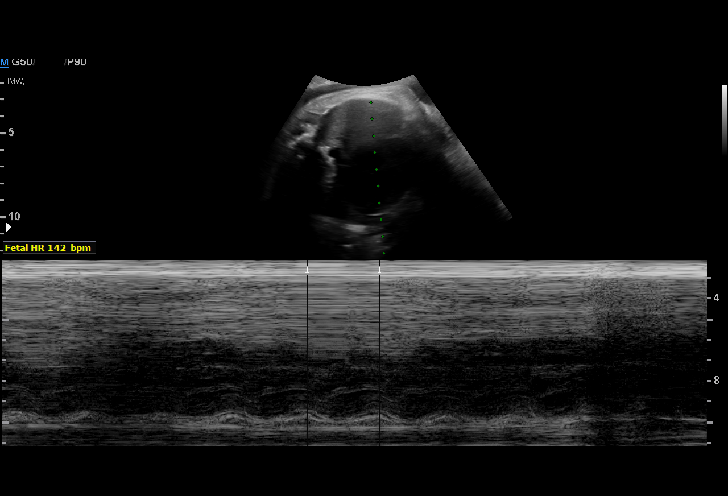

[14 of 23 positions shown; findings below may reference images not displayed]

Indications

 36 weeks gestation of pregnancy
 Maternal care for anti-D Rh antibodies,
 third trimester, unspecified
 Gestational diabetes in pregnancy,
 controlled by oral hypoglycemic drugs
 (metformin)
 Poor obstetric history: Previous preterm
 delivery, antepartum (36+6 weeks)
 Poor obstetric history: Previous gestational
 HTN
 Encounter for other antenatal screening
 follow-up
Fetal Evaluation

 Num Of Fetuses:         1
 Fetal Heart Rate(bpm):  142
 Cardiac Activity:       Observed
 Presentation:           Cephalic

 Amniotic Fluid
 AFI FV:      Oligohydramnios

 AFI Sum(cm)     %Tile       Largest Pocket(cm)
 4.1             < 3

 RUQ(cm)       RLQ(cm)       LUQ(cm)        LLQ(cm)
 0.9           1.1           1.1            1
Biophysical Evaluation
 Amniotic F.V:   Pocket => 2 cm             F. Tone:        Observed
 F. Movement:    Observed                   Score:          [DATE]
 F. Breathing:   Observed
OB History

 Gravidity:    9         Term:   3        Prem:   1        SAB:   3
 TOP:          0       Ectopic:  1        Living: 4
Gestational Age

 LMP:           36w 6d        Date:  02/11/19                 EDD:   11/18/19
 Best:          36w 6d     Det. By:  LMP  (02/11/19)          EDD:   11/18/19
Anatomy

 Thoracic:              Appears normal         Bladder:                Appears normal
 Stomach:               Appears normal, left
                        sided
Doppler - Fetal Vessels

 Middle Cerebral Artery
                                                      PSV   MoM
                                                    (cm/s)
                                                      52.5  < 1

Impression

 Patient returns for BPP and MCA Doppler studies.  She has
 gestational diabetes and takes Metformin 2666 mg in the
 morning and 500 mg in the evening.  She reports her blood
 glucose levels are now within normal range.
 Antibody titers were [DATE] following RhoGam injection and
 patient had a consultation with me last month.  She also has
 gestational thrombocytopenia and the most recent platelet
 count was 120K.

 On today's ultrasound, oligohydramnios is seen AFI=4 cm
 (maximum vertical pocket 1 cm).  Antenatal testing is
 reassuring.  BPP [DATE].  Cephalic presentation. Middle cerebral
 artery showed normal peak systolic velocity measurements
 (no evidence of fetal anemia).
 I explained the significance of oligohydramnios and
 recommended delivery now. Patient would like to wait till
 tomorrow night for her family to arrive.
 Since antenatal testing is reassuring, it is reasonable to wait
 till tomorrow.
 Discussed with Dr. Es.
Recommendations

 IOL tomorrow.
                      Larisa, [place]

## 2021-08-19 IMAGING — US US ABDOMEN LIMITED
1 series · 14 of 25 positions shown · non-contrast
Comparison: None.

CLINICAL DATA: Elevated liver function tests.

EXAM:
ULTRASOUND ABDOMEN LIMITED RIGHT UPPER QUADRANT

[Series 1: us abdomen limited · 0.12mm/px · 14 of 85 slices shown]
[im 1/85]
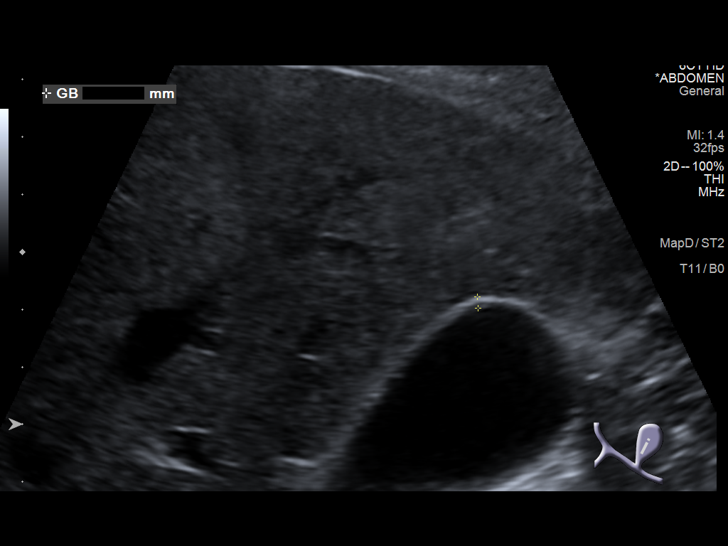
[im 8/85]
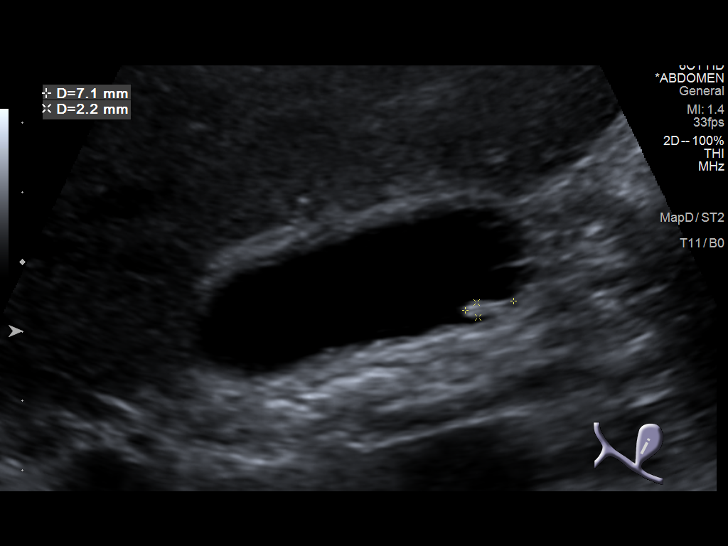
[im 15/85]
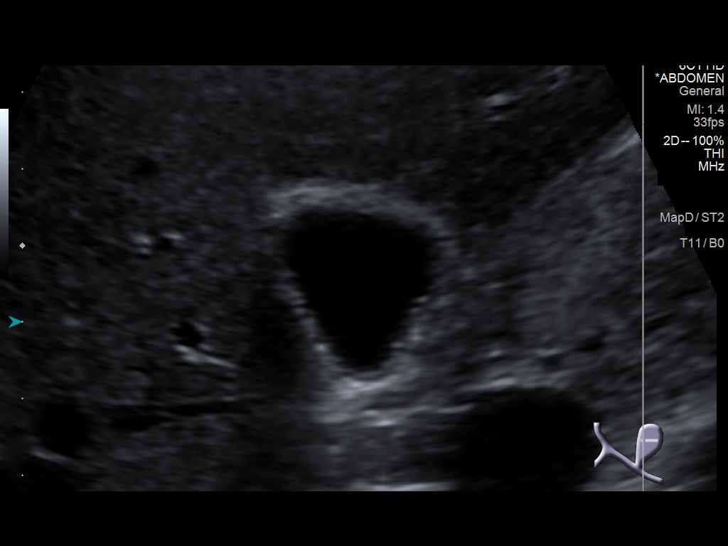
[im 22/85]
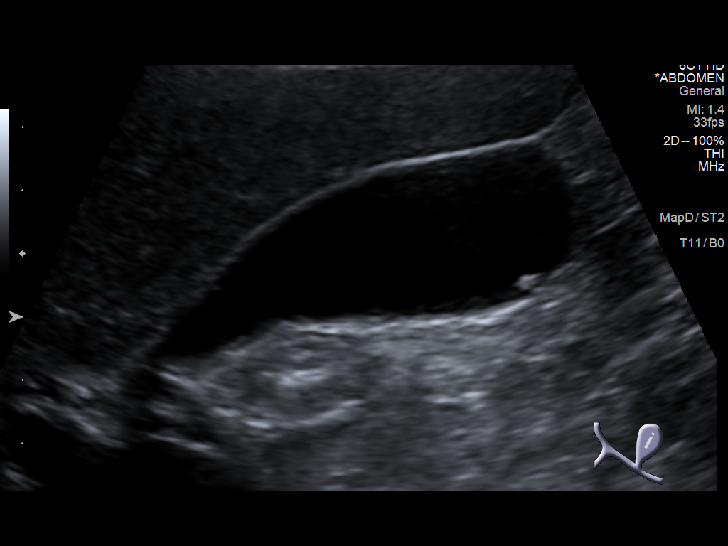
[im 29/85]
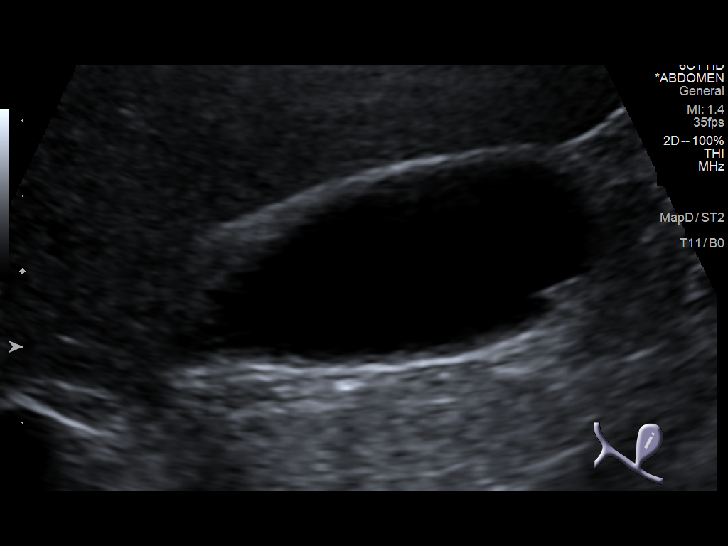
[im 32/85]
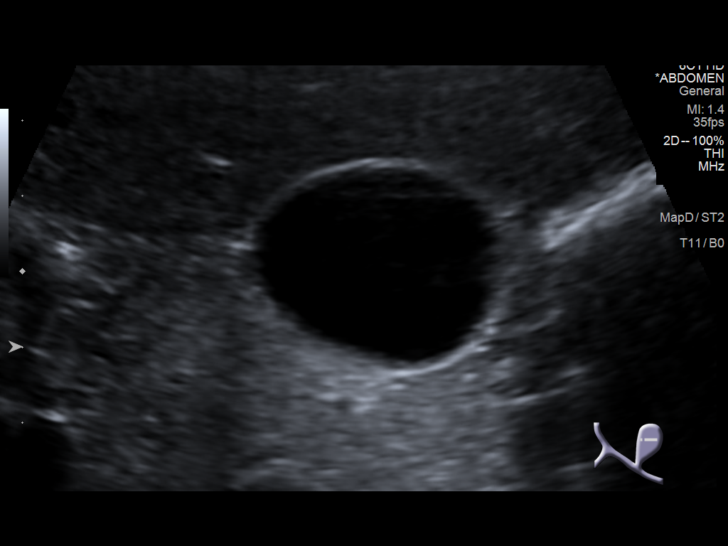
[im 39/85]
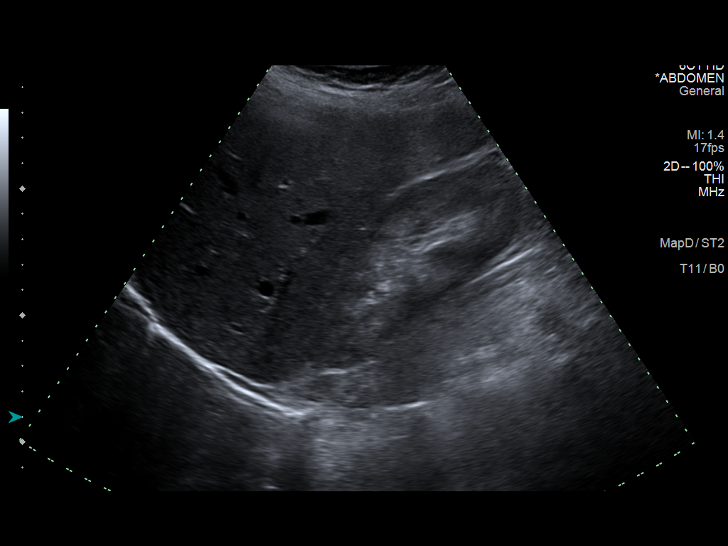
[im 46/85]
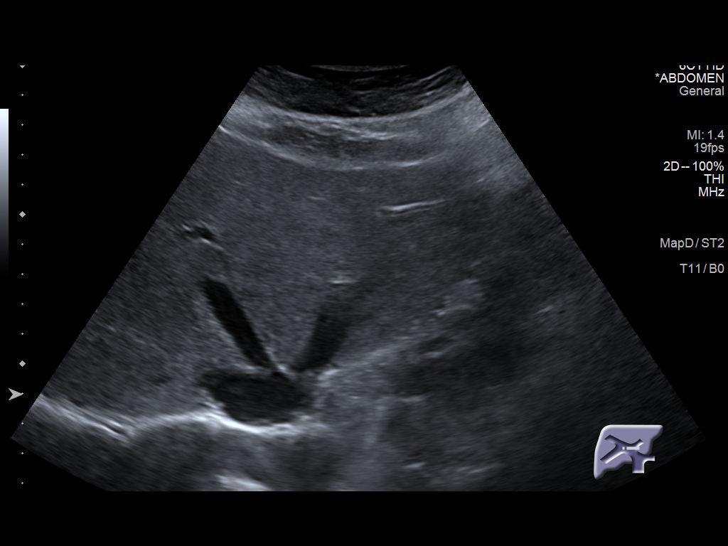
[im 53/85]
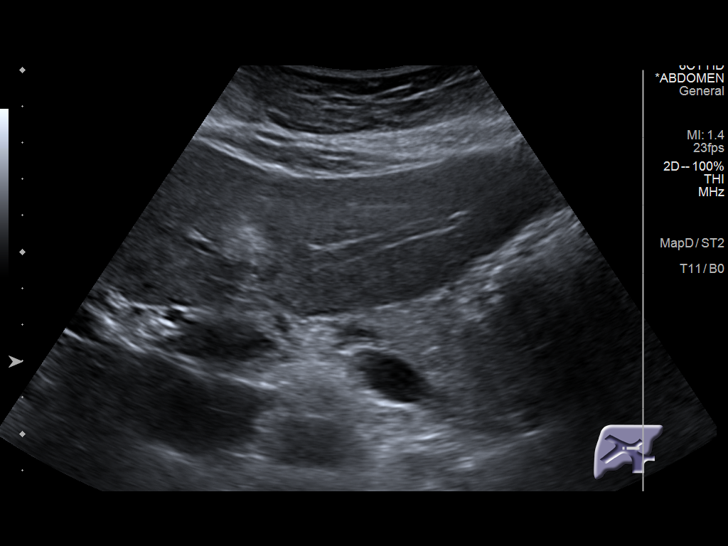
[im 57/85]
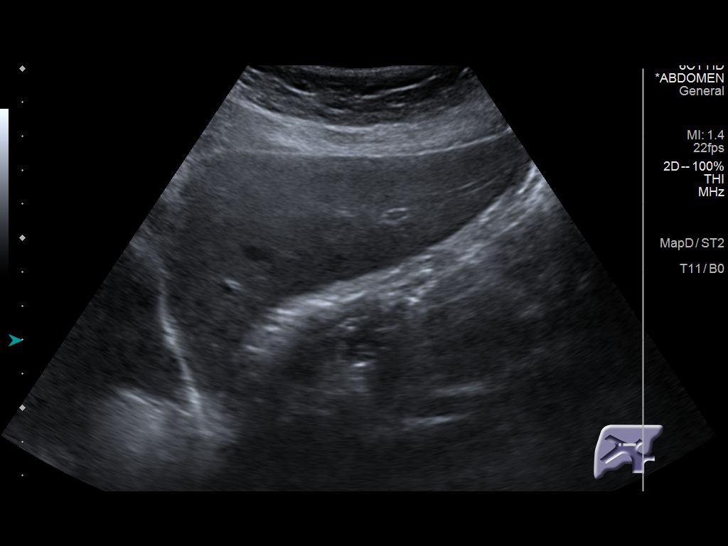
[im 64/85]
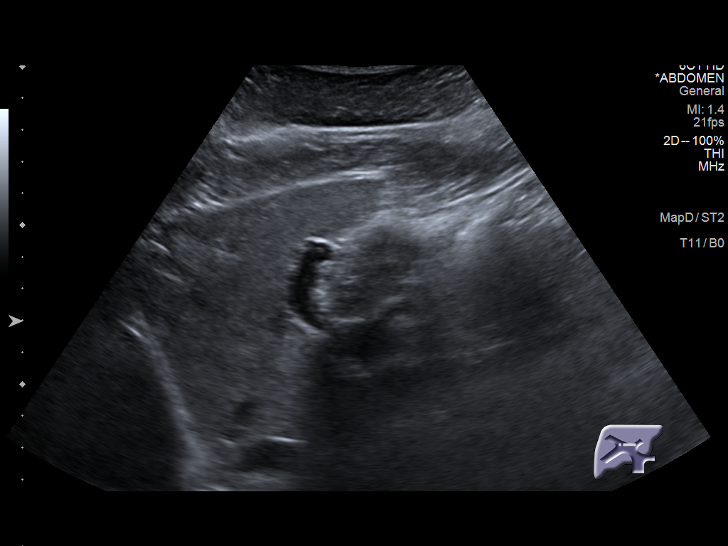
[im 71/85]
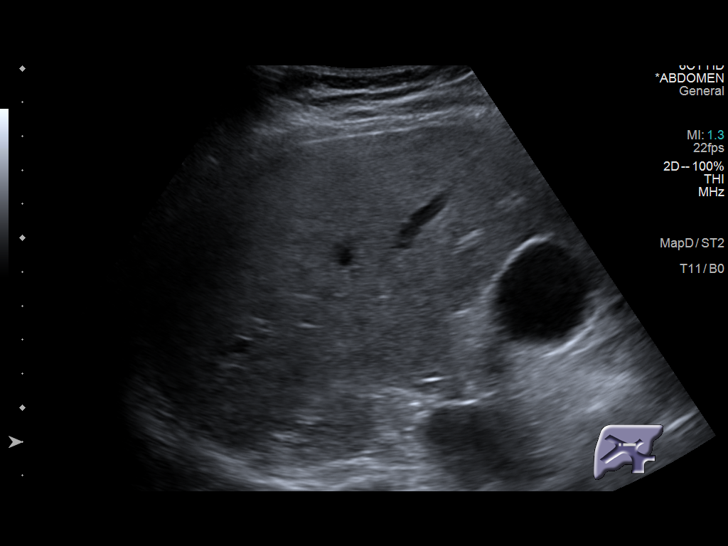
[im 78/85]
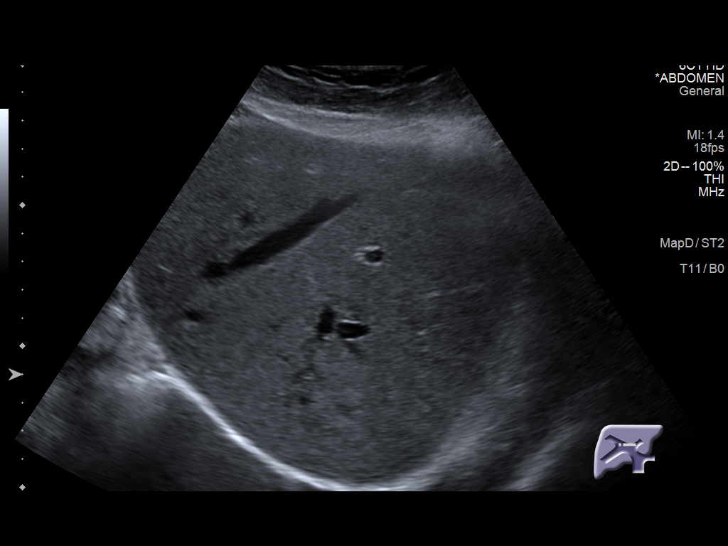
[im 85/85]
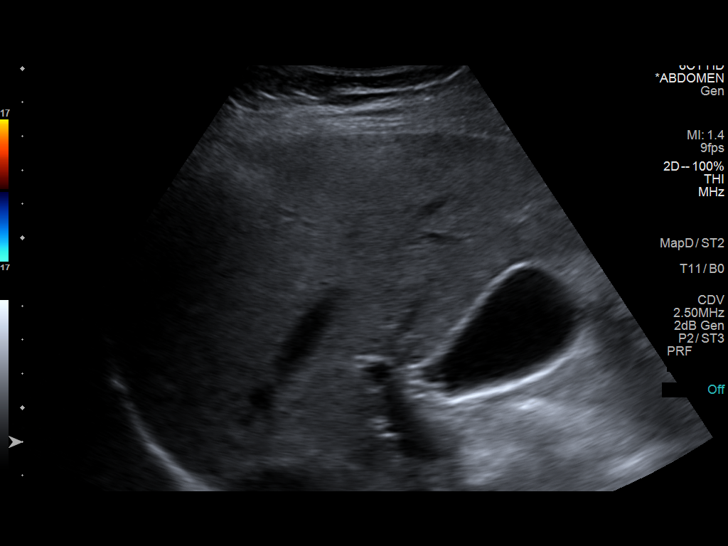

[14 of 25 positions shown; findings below may reference images not displayed]

FINDINGS: Gallbladder:

Single 0.7 cm stone is identified in the fundus of the gallbladder.
Negative for wall thickening or pericholecystic fluid. Sonographer
reports negative Murphy's sign.

Common bile duct:

Diameter: 0.5 cm.

Liver:

No focal lesion identified. Within normal limits in parenchymal
echogenicity. Portal vein is patent on color Doppler imaging with
normal direction of blood flow towards the liver.

Other: None.
IMPRESSION: Single 0.7 cm gallstone in the fundus of the gallbladder. The exam
is otherwise negative.

## 2021-12-02 ENCOUNTER — Other Ambulatory Visit: Payer: Self-pay

## 2024-01-19 ENCOUNTER — Other Ambulatory Visit: Payer: Self-pay | Admitting: Medical Genetics

## 2024-03-25 ENCOUNTER — Other Ambulatory Visit: Payer: Self-pay | Admitting: Medical Genetics

## 2024-03-25 DIAGNOSIS — Z006 Encounter for examination for normal comparison and control in clinical research program: Secondary | ICD-10-CM

## 2024-04-24 LAB — GENECONNECT MOLECULAR SCREEN

## 2024-04-25 ENCOUNTER — Telehealth: Payer: Self-pay | Admitting: Medical Genetics

## 2024-04-25 DIAGNOSIS — Z006 Encounter for examination for normal comparison and control in clinical research program: Secondary | ICD-10-CM

## 2024-04-25 NOTE — Telephone Encounter (Signed)
 [facility]  04/25/2024 2:30 PM  Confirmed I was speaking with Janice Hurley [phone] by using name and DOB. Informed participant the reason for this call is to follow-up on a recent sample the participant provided at one of the [facility] lab locations. Informed participant the test was not able to be completed with this sample and apologized for the inconvenience. Participant was requested to provide a new sample at one of our participating labs at no cost so that participant can continue participation and receive test results. Informed participant they do not need to be fasting and if there are other samples that need to be drawn, they can be done at the same visit. Participant has not had a blood transfusion or blood product in the last 30 days. Participant agreed to provide another sample. Participant was provided the [employer] program website to learn why this may have happened. Participant was thanked for their time and continued support of the above study.    Janice Hurley, BS [place]  [facility] Clinical Research Specialist II Direct Dial: [phone]  Fax: [phone]

## 2024-05-29 LAB — GENECONNECT MOLECULAR SCREEN: Genetic Analysis Overall Interpretation: NEGATIVE
